# Patient Record
Sex: Female | Born: 1935 | Race: White | Hispanic: No | Smoking: Never smoker
Health system: Southern US, Community
[De-identification: ages and names within clinical notes are randomized; demographics above are authoritative.]

## PROBLEM LIST (undated history)

## (undated) DIAGNOSIS — E079 Disorder of thyroid, unspecified: Secondary | ICD-10-CM

## (undated) DIAGNOSIS — G3184 Mild cognitive impairment, so stated: Secondary | ICD-10-CM

## (undated) DIAGNOSIS — F0390 Unspecified dementia without behavioral disturbance: Secondary | ICD-10-CM

## (undated) DIAGNOSIS — E039 Hypothyroidism, unspecified: Secondary | ICD-10-CM

## (undated) DIAGNOSIS — E785 Hyperlipidemia, unspecified: Secondary | ICD-10-CM

## (undated) DIAGNOSIS — I10 Essential (primary) hypertension: Secondary | ICD-10-CM

## (undated) DIAGNOSIS — H269 Unspecified cataract: Secondary | ICD-10-CM

---

## 2015-03-10 ENCOUNTER — Other Ambulatory Visit
Admission: RE | Admit: 2015-03-10 | Discharge: 2015-03-10 | Disposition: A | Discharge status: Home / Self Care | Payer: Medicare Other | Source: Ambulatory Visit | Attending: Nurse Practitioner | Admitting: Nurse Practitioner

## 2015-03-10 DIAGNOSIS — Z029 Encounter for administrative examinations, unspecified: Secondary | ICD-10-CM | POA: Insufficient documentation

## 2015-03-10 LAB — URINALYSIS COMPLETE WITH MICROSCOPIC (ARMC ONLY)
BILIRUBIN URINE: NEGATIVE
Glucose, UA: NEGATIVE mg/dL
KETONES UR: NEGATIVE mg/dL
Nitrite: POSITIVE — AB
PH: 5 (ref 5.0–8.0)
Protein, ur: NEGATIVE mg/dL
SPECIFIC GRAVITY, URINE: 1.01 (ref 1.005–1.030)

## 2015-03-14 ENCOUNTER — Other Ambulatory Visit
Admission: RE | Admit: 2015-03-14 | Discharge: 2015-03-14 | Disposition: A | Discharge status: Home / Self Care | Payer: Medicare Other | Source: Ambulatory Visit | Attending: Nurse Practitioner | Admitting: Nurse Practitioner

## 2015-03-14 DIAGNOSIS — R410 Disorientation, unspecified: Secondary | ICD-10-CM | POA: Insufficient documentation

## 2015-03-14 LAB — COMPREHENSIVE METABOLIC PANEL
ALT: 16 U/L (ref 14–54)
ANION GAP: 13 (ref 5–15)
AST: 23 U/L (ref 15–41)
Albumin: 4.1 g/dL (ref 3.5–5.0)
Alkaline Phosphatase: 95 U/L (ref 38–126)
BUN: 26 mg/dL — ABNORMAL HIGH (ref 6–20)
CALCIUM: 9.6 mg/dL (ref 8.9–10.3)
CO2: 25 mmol/L (ref 22–32)
CREATININE: 1.26 mg/dL — AB (ref 0.44–1.00)
Chloride: 100 mmol/L — ABNORMAL LOW (ref 101–111)
GFR calc Af Amer: 43 mL/min — ABNORMAL LOW (ref >60)
GFR calc non Af Amer: 37 mL/min — ABNORMAL LOW (ref >60)
GLUCOSE: 125 mg/dL — AB (ref 65–99)
Potassium: 4 mmol/L (ref 3.5–5.1)
Sodium: 138 mmol/L (ref 135–145)
TOTAL PROTEIN: 7.4 g/dL (ref 6.5–8.1)
Total Bilirubin: 0.2 mg/dL — ABNORMAL LOW (ref 0.3–1.2)

## 2015-03-14 LAB — CBC WITH DIFFERENTIAL/PLATELET
Basophils Absolute: 0 10*3/uL (ref 0–0.1)
Basophils Relative: 1 %
EOS ABS: 0.1 10*3/uL (ref 0–0.7)
EOS PCT: 1 %
HCT: 33.3 % — ABNORMAL LOW (ref 35.0–47.0)
HEMOGLOBIN: 11.2 g/dL — AB (ref 12.0–16.0)
LYMPHS ABS: 1.1 10*3/uL (ref 1.0–3.6)
LYMPHS PCT: 22 %
MCH: 31 pg (ref 26.0–34.0)
MCHC: 33.5 g/dL (ref 32.0–36.0)
MCV: 92.8 fL (ref 80.0–100.0)
MONOS PCT: 11 %
Monocytes Absolute: 0.5 10*3/uL (ref 0.2–0.9)
Neutro Abs: 3.2 10*3/uL (ref 1.4–6.5)
Neutrophils Relative %: 65 %
PLATELETS: 248 10*3/uL (ref 150–440)
RBC: 3.59 MIL/uL — ABNORMAL LOW (ref 3.80–5.20)
RDW: 14.1 % (ref 11.5–14.5)
WBC: 5 10*3/uL (ref 3.6–11.0)

## 2015-03-14 LAB — URINALYSIS COMPLETE WITH MICROSCOPIC (ARMC ONLY)
BILIRUBIN URINE: NEGATIVE
Glucose, UA: NEGATIVE mg/dL
KETONES UR: NEGATIVE mg/dL
NITRITE: NEGATIVE
Protein, ur: NEGATIVE mg/dL
SQUAMOUS EPITHELIAL / LPF: NONE SEEN — AB
Specific Gravity, Urine: 1.015 (ref 1.005–1.030)
pH: 6 (ref 5.0–8.0)

## 2015-03-16 ENCOUNTER — Telehealth: Payer: Self-pay | Admitting: Emergency Medicine

## 2015-03-16 NOTE — ED Notes (Signed)
Corbin AdeLynne-Anne Wolf RN notified Cammie Mcgeerystal [place] at [facility] that the patient Jannette SpannerGeraldine Baise was not seen [facility] and the lab orders were not ordered by any of our urgent care physicians.  She was informed that these Lab results would need to be notified to the physician who ordered these labs for the [facility] or her primary physician if he or she is the ordering physician.

## 2015-03-17 LAB — URINE CULTURE

## 2015-03-17 LAB — TSH: TSH: 0.441 u[IU]/mL (ref 0.350–4.500)

## 2015-03-21 ENCOUNTER — Other Ambulatory Visit
Admission: RE | Admit: 2015-03-21 | Discharge: 2015-03-21 | Disposition: A | Discharge status: Home / Self Care | Payer: Medicare Other | Source: Ambulatory Visit | Attending: Nurse Practitioner | Admitting: Nurse Practitioner

## 2015-03-21 DIAGNOSIS — R410 Disorientation, unspecified: Secondary | ICD-10-CM | POA: Insufficient documentation

## 2015-03-21 LAB — CBC WITH DIFFERENTIAL/PLATELET
BASOS PCT: 1 %
Basophils Absolute: 0 10*3/uL (ref 0–0.1)
Eosinophils Absolute: 0.1 10*3/uL (ref 0–0.7)
Eosinophils Relative: 1 %
HEMATOCRIT: 33 % — AB (ref 35.0–47.0)
HEMOGLOBIN: 11.1 g/dL — AB (ref 12.0–16.0)
LYMPHS PCT: 10 %
Lymphs Abs: 0.8 10*3/uL — ABNORMAL LOW (ref 1.0–3.6)
MCH: 31.2 pg (ref 26.0–34.0)
MCHC: 33.7 g/dL (ref 32.0–36.0)
MCV: 92.6 fL (ref 80.0–100.0)
MONOS PCT: 10 %
Monocytes Absolute: 0.9 10*3/uL (ref 0.2–0.9)
NEUTROS ABS: 6.5 10*3/uL (ref 1.4–6.5)
NEUTROS PCT: 78 %
PLATELETS: 255 10*3/uL (ref 150–440)
RBC: 3.56 MIL/uL — ABNORMAL LOW (ref 3.80–5.20)
RDW: 14.3 % (ref 11.5–14.5)
WBC: 8.3 10*3/uL (ref 3.6–11.0)

## 2015-03-21 LAB — COMPREHENSIVE METABOLIC PANEL
ALK PHOS: 93 U/L (ref 38–126)
ALT: 22 U/L (ref 14–54)
AST: 32 U/L (ref 15–41)
Albumin: 4.1 g/dL (ref 3.5–5.0)
Anion gap: 14 (ref 5–15)
BILIRUBIN TOTAL: 0.5 mg/dL (ref 0.3–1.2)
BUN: 29 mg/dL — AB (ref 6–20)
CALCIUM: 9.2 mg/dL (ref 8.9–10.3)
CO2: 24 mmol/L (ref 22–32)
Chloride: 98 mmol/L — ABNORMAL LOW (ref 101–111)
Creatinine, Ser: 1.51 mg/dL — ABNORMAL HIGH (ref 0.44–1.00)
GFR calc non Af Amer: 30 mL/min — ABNORMAL LOW (ref >60)
GFR, EST AFRICAN AMERICAN: 35 mL/min — AB (ref >60)
GLUCOSE: 134 mg/dL — AB (ref 65–99)
Potassium: 3.2 mmol/L — ABNORMAL LOW (ref 3.5–5.1)
SODIUM: 136 mmol/L (ref 135–145)
Total Protein: 7.6 g/dL (ref 6.5–8.1)

## 2015-05-09 ENCOUNTER — Other Ambulatory Visit
Admission: RE | Admit: 2015-05-09 | Discharge: 2015-05-09 | Disposition: A | Discharge status: Home / Self Care | Payer: Medicare Other | Source: Ambulatory Visit | Attending: Nurse Practitioner | Admitting: Nurse Practitioner

## 2015-05-09 DIAGNOSIS — E039 Hypothyroidism, unspecified: Secondary | ICD-10-CM | POA: Diagnosis not present

## 2015-05-09 DIAGNOSIS — F0390 Unspecified dementia without behavioral disturbance: Secondary | ICD-10-CM | POA: Insufficient documentation

## 2015-05-09 LAB — CBC
HCT: 35.1 % (ref 35.0–47.0)
Hemoglobin: 11.8 g/dL — ABNORMAL LOW (ref 12.0–16.0)
MCH: 32 pg (ref 26.0–34.0)
MCHC: 33.6 g/dL (ref 32.0–36.0)
MCV: 95.1 fL (ref 80.0–100.0)
Platelets: 278 10*3/uL (ref 150–440)
RBC: 3.69 MIL/uL — AB (ref 3.80–5.20)
RDW: 15.6 % — AB (ref 11.5–14.5)
WBC: 7.5 10*3/uL (ref 3.6–11.0)

## 2015-05-09 LAB — COMPREHENSIVE METABOLIC PANEL
ALBUMIN: 4.5 g/dL (ref 3.5–5.0)
ALT: 13 U/L — AB (ref 14–54)
AST: 23 U/L (ref 15–41)
Alkaline Phosphatase: 89 U/L (ref 38–126)
Anion gap: 14 (ref 5–15)
BUN: 24 mg/dL — AB (ref 6–20)
CHLORIDE: 100 mmol/L — AB (ref 101–111)
CO2: 23 mmol/L (ref 22–32)
CREATININE: 1.08 mg/dL — AB (ref 0.44–1.00)
Calcium: 9.8 mg/dL (ref 8.9–10.3)
GFR calc Af Amer: 52 mL/min — ABNORMAL LOW (ref >60)
GFR, EST NON AFRICAN AMERICAN: 45 mL/min — AB (ref >60)
GLUCOSE: 153 mg/dL — AB (ref 65–99)
POTASSIUM: 3.6 mmol/L (ref 3.5–5.1)
Sodium: 137 mmol/L (ref 135–145)
Total Bilirubin: 0.5 mg/dL (ref 0.3–1.2)
Total Protein: 7.3 g/dL (ref 6.5–8.1)

## 2015-05-09 LAB — TSH: TSH: 1.271 u[IU]/mL (ref 0.350–4.500)

## 2015-05-10 ENCOUNTER — Other Ambulatory Visit
Admission: RE | Admit: 2015-05-10 | Discharge: 2015-05-10 | Disposition: A | Discharge status: Home / Self Care | Payer: Medicare Other | Source: Ambulatory Visit | Attending: Nurse Practitioner | Admitting: Nurse Practitioner

## 2015-05-10 DIAGNOSIS — N390 Urinary tract infection, site not specified: Secondary | ICD-10-CM | POA: Insufficient documentation

## 2015-05-10 LAB — URINALYSIS COMPLETE WITH MICROSCOPIC (ARMC ONLY)
Bilirubin Urine: NEGATIVE
Glucose, UA: NEGATIVE mg/dL
Ketones, ur: NEGATIVE mg/dL
Nitrite: POSITIVE — AB
PROTEIN: NEGATIVE mg/dL
RBC / HPF: NONE SEEN RBC/hpf (ref <3)
Specific Gravity, Urine: 1.01 (ref 1.005–1.030)
pH: 6 (ref 5.0–8.0)

## 2015-05-12 LAB — URINE CULTURE: Culture: >=100000

## 2015-08-23 ENCOUNTER — Other Ambulatory Visit
Admission: RE | Admit: 2015-08-23 | Discharge: 2015-08-23 | Disposition: A | Discharge status: Home / Self Care | Payer: Medicare Other | Source: Skilled Nursing Facility | Attending: Nurse Practitioner | Admitting: Nurse Practitioner

## 2015-08-23 DIAGNOSIS — R413 Other amnesia: Secondary | ICD-10-CM | POA: Insufficient documentation

## 2015-08-23 LAB — URINALYSIS COMPLETE WITH MICROSCOPIC (ARMC ONLY)
Bilirubin Urine: NEGATIVE
Glucose, UA: NEGATIVE mg/dL
KETONES UR: NEGATIVE mg/dL
Nitrite: NEGATIVE
PH: 5 (ref 5.0–8.0)
PROTEIN: NEGATIVE mg/dL
SPECIFIC GRAVITY, URINE: 1.01 (ref 1.005–1.030)

## 2015-08-25 LAB — URINE CULTURE

## 2015-09-19 ENCOUNTER — Emergency Department: Payer: Medicare Other

## 2015-09-19 ENCOUNTER — Emergency Department
Admission: EM | Admit: 2015-09-19 | Discharge: 2015-09-19 | Disposition: A | Discharge status: Home / Self Care | Payer: Medicare Other | Attending: Emergency Medicine | Admitting: Emergency Medicine

## 2015-09-19 ENCOUNTER — Encounter: Payer: Self-pay | Admitting: Emergency Medicine

## 2015-09-19 DIAGNOSIS — I10 Essential (primary) hypertension: Secondary | ICD-10-CM | POA: Diagnosis not present

## 2015-09-19 DIAGNOSIS — S6992XA Unspecified injury of left wrist, hand and finger(s), initial encounter: Secondary | ICD-10-CM | POA: Diagnosis present

## 2015-09-19 DIAGNOSIS — Z7982 Long term (current) use of aspirin: Secondary | ICD-10-CM | POA: Diagnosis not present

## 2015-09-19 DIAGNOSIS — Y998 Other external cause status: Secondary | ICD-10-CM | POA: Insufficient documentation

## 2015-09-19 DIAGNOSIS — W1839XA Other fall on same level, initial encounter: Secondary | ICD-10-CM | POA: Diagnosis not present

## 2015-09-19 DIAGNOSIS — Y9389 Activity, other specified: Secondary | ICD-10-CM | POA: Insufficient documentation

## 2015-09-19 DIAGNOSIS — S52592A Other fractures of lower end of left radius, initial encounter for closed fracture: Secondary | ICD-10-CM | POA: Insufficient documentation

## 2015-09-19 DIAGNOSIS — Y92128 Other place in nursing home as the place of occurrence of the external cause: Secondary | ICD-10-CM | POA: Insufficient documentation

## 2015-09-19 DIAGNOSIS — S62102A Fracture of unspecified carpal bone, left wrist, initial encounter for closed fracture: Secondary | ICD-10-CM

## 2015-09-19 DIAGNOSIS — S60222A Contusion of left hand, initial encounter: Secondary | ICD-10-CM | POA: Insufficient documentation

## 2015-09-19 DIAGNOSIS — Z79899 Other long term (current) drug therapy: Secondary | ICD-10-CM | POA: Insufficient documentation

## 2015-09-19 HISTORY — DX: Hyperlipidemia, unspecified: E78.5

## 2015-09-19 HISTORY — DX: Essential (primary) hypertension: I10

## 2015-09-19 HISTORY — DX: Mild cognitive impairment of uncertain or unknown etiology: G31.84

## 2015-09-19 NOTE — ED Notes (Signed)
See triage note   States she fell 2 weeks ago. Left wrist bruised and swollen  Good pulses  Deformity present

## 2015-09-19 NOTE — ED Notes (Addendum)
Pt has had left wrist pain for 2 weeks. Bruising and swelling to left wrist.  Pt reports hurting wrist by falling.  Pt does not remember why initially fell.  Portable xray done at nursing facility.  Possible acute fracture vs chronic fracture. Sent here for further evaluation. Pt has xray report but no imaging. +2 radial pulse left arm.

## 2015-09-19 NOTE — ED Provider Notes (Signed)
[facility] Emergency Department Provider Note  ____________________________________________  Time seen: Approximately 1:53 PM  I have reviewed the triage vital signs and the nursing notes.   HISTORY  Chief Complaint Wrist Pain  History limited secondary to mental status at present. More history obtained after calling nursing facility.  HPI Alice Manning is a 82 y.o. female who presents today for left wrist pain for one week. Patient thinks she may have fallen and hurt her wrist but doesn't remember when or why she fell or how long her wrist has been hurting. Per nursing facility report, patient wandered out of facility and fell on her wrist last Monday 09/11/2015. After noticing swelling and bruising in her left wrist, nursing facility completed a portable xray. Reports sent with patient but no imaging. Patient states it is "a little bit" painful. Patient unsure of why she came to the ED other than someone brought her. Denies any pain other than left wrist.    Past Medical History  Diagnosis Date  . Hyperlipemia   . Hypertension   . Mild cognitive impairment     There are no active problems to display for this patient.   History reviewed. No pertinent past surgical history.  Current Outpatient Rx  Name  Route  Sig  Dispense  Refill  . aspirin 81 MG tablet   Oral   Take 81 mg by mouth daily.         Marland Kitchen diltiazem (TIAZAC) 360 MG 24 hr capsule   Oral   Take 360 mg by mouth daily.         Marland Kitchen escitalopram (LEXAPRO) 20 MG tablet   Oral   Take 20 mg by mouth daily.         . furosemide (LASIX) 40 MG tablet   Oral   Take 40 mg by mouth.         . levothyroxine (SYNTHROID, LEVOTHROID) 75 MCG tablet   Oral   Take 75 mcg by mouth daily before breakfast.         . lisinopril (PRINIVIL,ZESTRIL) 40 MG tablet   Oral   Take 40 mg by mouth daily.         . memantine (NAMENDA) 10 MG tablet   Oral   Take 10 mg by mouth 2 (two) times  daily.         . vitamin B-12 (CYANOCOBALAMIN) 100 MCG tablet   Oral   Take 100 mcg by mouth daily.           Allergies Review of patient's allergies indicates no known allergies.  History reviewed. No pertinent family history.  Social History Social History  Substance Use Topics  . Smoking status: Never Smoker   . Smokeless tobacco: None  . Alcohol Use: No    Review of Systems Constitutional: No fever/chills Eyes: No visual changes. ENT: No sore throat. Cardiovascular: Denies chest pain. Respiratory: Denies shortness of breath. Gastrointestinal: No abdominal pain.  No nausea, no vomiting.  No diarrhea.  No constipation. Genitourinary: Negative for dysuria. Musculoskeletal: Positive for left wrist and finger pain . Negative for back pain. Skin: Positive for swelling and bruising of left hand. Negative for rash. Neurological: Positive for weakness in left hand due to injury. Negative for headaches or numbness.   10-point ROS otherwise negative.  ____________________________________________   PHYSICAL EXAM:  VITAL SIGNS: ED Triage Vitals  Enc Vitals Group     BP 09/19/15 1332 112/87 mmHg     Pulse Rate  09/19/15 1332 73     Resp 09/19/15 1332 18     Temp 09/19/15 1332 98.2 F (36.8 C)     Temp Source 09/19/15 1332 Oral     SpO2 09/19/15 1332 98 %     Weight 09/19/15 1332 104 lb 12.8 oz (47.537 kg)     Height --      Head Cir --      Peak Flow --      Pain Score 09/19/15 1328 5     Pain Loc --      Pain Edu? --      Excl. in GC? --     Constitutional: Alert and slightly unsure of circumstances surrounding present condition with wrist and visit to ED. Well appearing and in no acute distress. Eyes: Conjunctivae are normal. PERRL. Left eye slightly deviant laterally. Head: Atraumatic. Nose: No congestion/rhinnorhea. Mouth/Throat: Mucous membranes are moist.  Oropharynx non-erythematous. Neck: No stridor.  Cardiovascular: Normal rate, regular rhythm.  Grossly normal heart sounds.  Good peripheral circulation. Respiratory: Normal respiratory effort.  No retractions. Lungs CTAB. Gastrointestinal: Soft and nontender. No distention. No abdominal bruits. No CVA tenderness. Musculoskeletal: Limited ROM in all directions in left wrist when compared to right wrist. ROM in limited in first digit of left hand but in tact in all other digits. Decreased strength in left wrist and digits. Significant edema in left wrist with obvious deformity. No lower extremity tenderness nor edema.  No joint effusions. Neurologic:  Normal speech and language. No gross focal neurologic deficits are appreciated. No gait instability. Skin:  Significant ecchymosis on anterior aspect of left forearm and wrist. Skin is warm, dry and intact.  Psychiatric: Mood and affect are slightly blunted. Patient seems confused about circumstances and why she is in the ED. Speech and behavior are normal.  ____________________________________________   LABS (all labs ordered are listed, but only abnormal results are displayed)  Labs Reviewed - No data to display ____________________________________________  EKG   ____________________________________________  RADIOLOGY  Comminuted fracture of the distal radius with dorsal angulation of 30 ____________________________________________   PROCEDURES  Procedure(s) performed: None  Critical Care performed: No  ____________________________________________   INITIAL IMPRESSION / ASSESSMENT AND PLAN / ED COURSE  Pertinent labs & imaging results that were available during my care of the patient were reviewed by me and considered in my medical decision making (see chart for details).  Left wrist fracture. Discussed  x-ray finding with the patient and daughter. Patient placed in a volar splint and advised follow orthopedics by calling for an appointment in the morning. ____________________________________________   FINAL  CLINICAL IMPRESSION(S) / ED DIAGNOSES  Final diagnoses:  Wrist fracture, left, closed, initial encounter      Joni Reining, PA-C 09/19/15 1442  Rockne Menghini, MD 09/19/15 [phone]

## 2015-09-19 NOTE — Discharge Instructions (Signed)
Wear splint until evaluation by orthopedic clinic Wrist Fracture A wrist fracture is a break or crack in one of the bones of your wrist. Your wrist is made up of eight small bones at the palm of your hand (carpal bones) and two long bones that make up your forearm (radius and ulna). CAUSES  A direct blow to the wrist.  Falling on an outstretched hand.  Trauma, such as a car accident or a fall. RISK FACTORS Risk factors for wrist fracture include:  Participating in contact and high-risk sports, such as skiing, biking, and ice skating.  Taking steroid medicines.  Smoking.  Being female.  Being Caucasian.  Drinking more than three alcoholic beverages per day.  Having low or lowered bone density (osteoporosis or osteopenia).  Age. Older adults have decreased bone density.  Women who have had menopause.  History of previous fractures. SIGNS AND SYMPTOMS Symptoms of wrist fractures include tenderness, bruising, and inflammation. Additionally, the wrist may hang in an odd position or appear deformed. DIAGNOSIS Diagnosis may include:  Physical exam.  X-ray. TREATMENT Treatment depends on many factors, including the nature and location of the fracture, your age, and your activity level. Treatment for wrist fracture can be nonsurgical or surgical. Nonsurgical Treatment A plaster cast or splint may be applied to your wrist if the bone is in a good position. If the fracture is not in good position, it may be necessary for your health care provider to realign it before applying a splint or cast. Usually, a cast or splint will be worn for several weeks. Surgical Treatment Sometimes the position of the bone is so far out of place that surgery is required to apply a device to hold it together as it heals. Depending on the fracture, there are a number of options for holding the bone in place while it heals, such as a cast and metal pins. HOME CARE INSTRUCTIONS  Keep your injured wrist  elevated and move your fingers as much as possible.  Do not put pressure on any part of your cast or splint. It may break.  Use a plastic bag to protect your cast or splint from water while bathing or showering. Do not lower your cast or splint into water.  Take medicines only as directed by your health care provider.  Keep your cast or splint clean and dry. If it becomes wet, damaged, or suddenly feels too tight, contact your health care provider right away.  Do not use any tobacco products including cigarettes, chewing tobacco, or electronic cigarettes. Tobacco can delay bone healing. If you need help quitting, ask your health care provider.  Keep all follow-up visits as directed by your health care provider. This is important.  Ask your health care provider if you should take supplements of calcium and vitamins C and D to promote bone healing. SEEK MEDICAL CARE IF:  Your cast or splint is damaged, breaks, or gets wet.  You have a fever.  You have chills.  You have continued severe pain or more swelling than you did before the cast was put on. SEEK IMMEDIATE MEDICAL CARE IF:  Your hand or fingernails on the injured arm turn blue or gray, or feel cold or numb.  You have decreased feeling in the fingers of your injured arm. MAKE SURE YOU:  Understand these instructions.  Will watch your condition.  Will get help right away if you are not doing well or get worse.   This information is not intended to  replace advice given to you by your health care provider. Make sure you discuss any questions you have with your health care provider.   Document Released: 05/29/2005 Document Revised: 05/10/2015 Document Reviewed: 09/06/2011 Elsevier Interactive Patient Education [employer]

## 2015-12-16 ENCOUNTER — Emergency Department: Payer: Medicare Other

## 2015-12-16 ENCOUNTER — Emergency Department
Admission: EM | Admit: 2015-12-16 | Discharge: 2015-12-16 | Disposition: A | Discharge status: Home / Self Care | Payer: Medicare Other | Attending: Emergency Medicine | Admitting: Emergency Medicine

## 2015-12-16 ENCOUNTER — Encounter: Payer: Self-pay | Admitting: *Deleted

## 2015-12-16 DIAGNOSIS — E785 Hyperlipidemia, unspecified: Secondary | ICD-10-CM | POA: Insufficient documentation

## 2015-12-16 DIAGNOSIS — I10 Essential (primary) hypertension: Secondary | ICD-10-CM | POA: Insufficient documentation

## 2015-12-16 DIAGNOSIS — S42002A Fracture of unspecified part of left clavicle, initial encounter for closed fracture: Secondary | ICD-10-CM | POA: Insufficient documentation

## 2015-12-16 DIAGNOSIS — Z7982 Long term (current) use of aspirin: Secondary | ICD-10-CM | POA: Insufficient documentation

## 2015-12-16 DIAGNOSIS — Y939 Activity, unspecified: Secondary | ICD-10-CM | POA: Diagnosis not present

## 2015-12-16 DIAGNOSIS — F0390 Unspecified dementia without behavioral disturbance: Secondary | ICD-10-CM | POA: Insufficient documentation

## 2015-12-16 DIAGNOSIS — Y999 Unspecified external cause status: Secondary | ICD-10-CM | POA: Diagnosis not present

## 2015-12-16 DIAGNOSIS — W19XXXA Unspecified fall, initial encounter: Secondary | ICD-10-CM | POA: Insufficient documentation

## 2015-12-16 DIAGNOSIS — Y929 Unspecified place or not applicable: Secondary | ICD-10-CM | POA: Insufficient documentation

## 2015-12-16 DIAGNOSIS — M79643 Pain in unspecified hand: Secondary | ICD-10-CM

## 2015-12-16 DIAGNOSIS — Z79899 Other long term (current) drug therapy: Secondary | ICD-10-CM | POA: Insufficient documentation

## 2015-12-16 DIAGNOSIS — M79642 Pain in left hand: Secondary | ICD-10-CM | POA: Diagnosis present

## 2015-12-16 LAB — URINALYSIS COMPLETE WITH MICROSCOPIC (ARMC ONLY)
Bilirubin Urine: NEGATIVE
GLUCOSE, UA: NEGATIVE mg/dL
KETONES UR: NEGATIVE mg/dL
NITRITE: POSITIVE — AB
Protein, ur: NEGATIVE mg/dL
SPECIFIC GRAVITY, URINE: 1.008 (ref 1.005–1.030)
pH: 6 (ref 5.0–8.0)

## 2015-12-16 LAB — GLUCOSE, CAPILLARY: Glucose-Capillary: 89 mg/dL (ref 65–99)

## 2015-12-16 MED ORDER — IBUPROFEN 400 MG PO TABS
400.0000 mg | ORAL_TABLET | Freq: Once | ORAL | Status: AC
  Administered 2015-12-16: 400 mg via ORAL

## 2015-12-16 MED ORDER — FOSFOMYCIN TROMETHAMINE 3 G PO PACK
3.0000 g | PACK | ORAL | Status: AC
  Administered 2015-12-16: 3 g via ORAL
  Filled 2015-12-16: qty 3

## 2015-12-16 MED ORDER — ACETAMINOPHEN 325 MG PO TABS
650.0000 mg | ORAL_TABLET | Freq: Once | ORAL | Status: AC
  Administered 2015-12-16: 650 mg via ORAL
  Filled 2015-12-16: qty 2

## 2015-12-16 MED ORDER — IBUPROFEN 400 MG PO TABS
ORAL_TABLET | ORAL | Status: AC
  Administered 2015-12-16: 400 mg via ORAL
  Filled 2015-12-16: qty 1

## 2015-12-16 NOTE — ED Notes (Signed)
Pt. Family taking pt. To [facility].

## 2015-12-16 NOTE — ED Notes (Signed)
Report given to Kim, RN.

## 2015-12-16 NOTE — ED Notes (Signed)
Patient transported to CT 

## 2015-12-16 NOTE — ED Provider Notes (Addendum)
[facility] Emergency Department Provider Note  ____________________________________________   I have reviewed the triage vital signs and the nursing notes.   HISTORY  Chief Complaint Fall    HPI Alice Manning is a 82 y.o. female who presents today after a fall. Patient is significantly demented, family states she is at her baseline. She takes aspirin but no other blood thinners. She hit her head. There is no reported loss of consciousness. History is limited because patient's dementia. She states she remembers falling. Patient has chronic pain in her left arm from a prior wrist injury, but she states she is having some shoulder discomfort this time as well.   Past Medical History  Diagnosis Date  . Hyperlipemia   . Hypertension   . Mild cognitive impairment     There are no active problems to display for this patient.   History reviewed. No pertinent past surgical history.  Current Outpatient Rx  Name  Route  Sig  Dispense  Refill  . aspirin 81 MG tablet   Oral   Take 81 mg by mouth daily.         Marland Kitchen. diltiazem (TIAZAC) 360 MG 24 hr capsule   Oral   Take 360 mg by mouth daily.         Marland Kitchen. escitalopram (LEXAPRO) 20 MG tablet   Oral   Take 20 mg by mouth daily.         . furosemide (LASIX) 40 MG tablet   Oral   Take 40 mg by mouth.         . levothyroxine (SYNTHROID, LEVOTHROID) 75 MCG tablet   Oral   Take 75 mcg by mouth daily before breakfast.         . lisinopril (PRINIVIL,ZESTRIL) 40 MG tablet   Oral   Take 40 mg by mouth daily.         . memantine (NAMENDA) 10 MG tablet   Oral   Take 10 mg by mouth 2 (two) times daily.         . vitamin B-12 (CYANOCOBALAMIN) 100 MCG tablet   Oral   Take 100 mcg by mouth daily.           Allergies Review of patient's allergies indicates no known allergies.  History reviewed. No pertinent family history.  Social History Social History  Substance Use Topics  . Smoking  status: Never Smoker   . Smokeless tobacco: None  . Alcohol Use: No    Review of Systems Constitutional: No fever/chills Eyes: No visual changes. ENT: No sore throat. No stiff neck no neck pain Cardiovascular: Denies chest pain. Respiratory: Denies shortness of breath. Gastrointestinal:   no vomiting.  No diarrhea.  No constipation. Genitourinary: Negative for dysuria. Musculoskeletal: Negative lower extremity swelling Skin: Negative for rash. Neurological: Negative for headaches, focal weakness or numbness. 10-point ROS otherwise negative.  ____________________________________________   PHYSICAL EXAM:  VITAL SIGNS: ED Triage Vitals  Enc Vitals Group     BP 12/16/15 1244 127/51 mmHg     Pulse Rate 12/16/15 1244 71     Resp 12/16/15 1244 16     Temp 12/16/15 1244 98.8 F (37.1 C)     Temp Source 12/16/15 1244 Oral     SpO2 12/16/15 1244 100 %     Weight 12/16/15 1244 110 lb (49.896 kg)     Height 12/16/15 1244 5\' 4"  (1.626 m)     Head Cir --      Peak  Flow --      Pain Score --      Pain Loc --      Pain Edu? --      Excl. in GC? --     Constitutional: Alert and oriented. Well appearing and in no acute distress. Eyes: Conjunctivae are normal. PERRL. EOMI. Head: Superficial abrasion to the left orbital ridge no skull fracture palpated. Nose: No congestion/rhinnorhea. Mouth/Throat: Mucous membranes are moist.  Oropharynx non-erythematous. Neck: No stridor.   Nontender with no meningismus Cardiovascular: Normal rate, regular rhythm. Grossly normal heart sounds.  Good peripheral circulation. Respiratory: Normal respiratory effort.  No retractions. Lungs CTAB. Abdominal: Soft and nontender. No distention. No guarding no rebound Back:  There is no focal tenderness or step off there is no midline tenderness there are no lesions noted. there is no CVA tenderness Musculoskeletal: There is tenderness to palpation of the distal clavicle, she also has tenderness to palpation  and some mild swelling of the left hand and wrist which appears to be chronic pulses are noted. No lower extremity tenderness. No joint effusions, no DVT signs strong distal pulses no edema Neurologic:  Normal speech and language. No gross focal neurologic deficits are appreciated.  Skin:  Skin is warm, dry and intact. No rash noted. Psychiatric: Mood and affect are normal. Speech and behavior are normal.  ____________________________________________   LABS (all labs ordered are listed, but only abnormal results are displayed)  Labs Reviewed - No data to display ____________________________________________  EKG  I personally interpreted any EKGs ordered by me or triage  ____________________________________________  RADIOLOGY  I reviewed any imaging ordered by me or triage that were performed during my shift and, if possible, patient and/or family made aware of any abnormal findings. ____________________________________________   PROCEDURES  Procedure(s) performed: LACERATION REPAIR Performed by: Jeanmarie Plant Authorized by: Jeanmarie Plant Consent: Verbal consent obtained. Risks and benefits: risks, benefits and alternatives were discussed Consent given by: patient Patient identity confirmed: provided demographic data Prepped and Draped in normal sterile fashion Wound explored  Laceration Location: Left renal ridge  Laceration Length: 2.7 cm  No Foreign Bodies seen or palpated  Anesthesia: local infiltration  Local anesthetic:0  Anesthetic total: 0 ml  Irrigation method: syringe Amount of cleaning: standard  Skin closure: Dermabond   Number of sutures: 0   Technique: dermabond  Patient tolerance: Patient tolerated the procedure well with no immediate complications.   Critical Care performed: None  ____________________________________________   INITIAL IMPRESSION / ASSESSMENT AND PLAN / ED COURSE  Pertinent labs & imaging results that were  available during my care of the patient were reviewed by me and considered in my medical decision making (see chart for details).  Patient with a non-syncopal fall, at her baseline according to family, he did ask me to not send blood work or do any other workup, patient has history of frequent falls and they feel that she is at her baseline. I do not think this is unreasonable. I have imaged her left arm for this pain, she does have a clavicular fracture, we will see if other injuries are present. No other area of concern is noted by the patient. I believe that we can likely repair the area that was injured on her face with Dermabond.  ----------------------------------------- 4:00 PM on 12/16/2015 -----------------------------------------  The patient is in no acute distress, at her baseline according to family. They say she will not take anything started and Tylenol for pain which we will  administer and they will give at home. Neurovascularly intact. She does have some chronic swelling after her last injury in her hand but no evidence of DVT and there are strong distal pulses, we will advise close follow-up patient follow up with orthopedic surgery for her new fracture and for reassessment of her old. No evidence of acute wrist fracture at this time. ____________________________________________   FINAL CLINICAL IMPRESSION(S) / ED DIAGNOSES  Final diagnoses:  Hand pain      This chart was dictated using voice recognition software.  Despite best efforts to proofread,  errors can occur which can change meaning.     Jeanmarie Plant, MD 12/16/15 1503  Jeanmarie Plant, MD 12/16/15 1503  Jeanmarie Plant, MD 12/16/15 1556  Jeanmarie Plant, MD 12/16/15 1600

## 2015-12-16 NOTE — ED Notes (Signed)
Pt to ED from [facility] after fall today. Pt with small abrasion noted to left side of head, above eyebrow. Bleeding controlled. Pt not currently taking blood thinners. Vitals stable at this time, NAD noted. Pt baseline dementia per EMS. MD Mcshane at bedside upon arrival.

## 2015-12-16 NOTE — Discharge Instructions (Signed)
Clavicle Fracture °A clavicle fracture is a broken collarbone. The collarbone is the long bone that connects your shoulder to your rib cage. A broken collarbone may be treated with a sling, a wrap, or surgery. Treatment depends on whether the broken ends of the bone are out of place or not. °HOME CARE °· Put ice on the injured area: °¨ Put ice in a plastic bag. °¨ Place a towel between your skin and the bag. °¨ Leave the ice on for 20 minutes, 2-3 times a day. °· If you have a wrap or splint: °¨ Wear it all the time, and remove it only to take a bath or shower. °¨ When you bathe or shower, keep your shoulder in the same place as when the sling or wrap is on. °¨ Do not lift your arm. °· If you have a wrap: °¨ Another person must tighten it every day. °¨ It should be tight enough to hold your shoulders back. °¨ Make sure you have enough room to put your pointer finger between your body and the strap. °¨ Loosen the wrap right away if you cannot feel your arm or your hands tingle. °· Only take medicines as told by your doctor. °· Avoid activities that make the injury or pain worse for 4-6 weeks after surgery. °· Keep all follow-up appointments. °GET HELP IF: °· Your medicine is not making you feel less pain. °· Your medicine is not making swelling better. °GET HELP RIGHT AWAY IF:  °· Your cannot feel your arm. °· Your arm is cold. °· Your arm is a lighter color than normal. °MAKE SURE YOU:  °· Understand these instructions. °· Will watch your condition. °· Will get help right away if you are not doing well or get worse. °  °This information is not intended to replace advice given to you by your health care provider. Make sure you discuss any questions you have with your health care provider. °  °Document Released: 02/05/2008 Document Revised: 08/24/2013 Document Reviewed: 07/12/2013 °Elsevier Interactive Patient Education ©2016 [employer] ° °

## 2015-12-22 ENCOUNTER — Other Ambulatory Visit
Admission: RE | Admit: 2015-12-22 | Discharge: 2015-12-22 | Disposition: A | Discharge status: Home / Self Care | Payer: Medicare Other | Source: Ambulatory Visit | Attending: Nurse Practitioner | Admitting: Nurse Practitioner

## 2015-12-22 DIAGNOSIS — R8290 Unspecified abnormal findings in urine: Secondary | ICD-10-CM | POA: Diagnosis present

## 2015-12-22 LAB — URINALYSIS COMPLETE WITH MICROSCOPIC (ARMC ONLY)
BACTERIA UA: NONE SEEN
Bilirubin Urine: NEGATIVE
Glucose, UA: NEGATIVE mg/dL
HGB URINE DIPSTICK: NEGATIVE
Ketones, ur: NEGATIVE mg/dL
LEUKOCYTES UA: NEGATIVE
NITRITE: NEGATIVE
PROTEIN: NEGATIVE mg/dL
Specific Gravity, Urine: 1.01 (ref 1.005–1.030)
Squamous Epithelial / LPF: NONE SEEN
WBC UA: NONE SEEN WBC/hpf (ref 0–5)
pH: 5.5 (ref 5.0–8.0)

## 2015-12-24 LAB — URINE CULTURE

## 2015-12-29 ENCOUNTER — Emergency Department: Payer: Medicare Other

## 2015-12-29 ENCOUNTER — Encounter: Payer: Self-pay | Admitting: Emergency Medicine

## 2015-12-29 ENCOUNTER — Emergency Department
Admission: EM | Admit: 2015-12-29 | Discharge: 2015-12-29 | Disposition: A | Discharge status: Home / Self Care | Payer: Medicare Other | Attending: Emergency Medicine | Admitting: Emergency Medicine

## 2015-12-29 DIAGNOSIS — Z7982 Long term (current) use of aspirin: Secondary | ICD-10-CM | POA: Diagnosis not present

## 2015-12-29 DIAGNOSIS — Y999 Unspecified external cause status: Secondary | ICD-10-CM | POA: Insufficient documentation

## 2015-12-29 DIAGNOSIS — S0003XA Contusion of scalp, initial encounter: Secondary | ICD-10-CM | POA: Insufficient documentation

## 2015-12-29 DIAGNOSIS — N3090 Cystitis, unspecified without hematuria: Secondary | ICD-10-CM | POA: Diagnosis not present

## 2015-12-29 DIAGNOSIS — Y9389 Activity, other specified: Secondary | ICD-10-CM | POA: Insufficient documentation

## 2015-12-29 DIAGNOSIS — Y929 Unspecified place or not applicable: Secondary | ICD-10-CM | POA: Diagnosis not present

## 2015-12-29 DIAGNOSIS — W050XXA Fall from non-moving wheelchair, initial encounter: Secondary | ICD-10-CM | POA: Diagnosis not present

## 2015-12-29 DIAGNOSIS — E785 Hyperlipidemia, unspecified: Secondary | ICD-10-CM | POA: Diagnosis not present

## 2015-12-29 DIAGNOSIS — W19XXXA Unspecified fall, initial encounter: Secondary | ICD-10-CM

## 2015-12-29 DIAGNOSIS — S0990XA Unspecified injury of head, initial encounter: Secondary | ICD-10-CM | POA: Diagnosis present

## 2015-12-29 DIAGNOSIS — I10 Essential (primary) hypertension: Secondary | ICD-10-CM | POA: Insufficient documentation

## 2015-12-29 DIAGNOSIS — Z79899 Other long term (current) drug therapy: Secondary | ICD-10-CM | POA: Diagnosis not present

## 2015-12-29 LAB — URINALYSIS COMPLETE WITH MICROSCOPIC (ARMC ONLY)
BILIRUBIN URINE: NEGATIVE
Glucose, UA: NEGATIVE mg/dL
Hgb urine dipstick: NEGATIVE
KETONES UR: NEGATIVE mg/dL
NITRITE: NEGATIVE
PROTEIN: NEGATIVE mg/dL
SPECIFIC GRAVITY, URINE: 1.001 — AB (ref 1.005–1.030)
pH: 6 (ref 5.0–8.0)

## 2015-12-29 MED ORDER — FOSFOMYCIN TROMETHAMINE 3 G PO PACK
PACK | ORAL | Status: AC
  Filled 2015-12-29: qty 3

## 2015-12-29 MED ORDER — FOSFOMYCIN TROMETHAMINE 3 G PO PACK
3.0000 g | PACK | Freq: Once | ORAL | Status: AC
  Administered 2015-12-29: 3 g via ORAL

## 2015-12-29 NOTE — ED Provider Notes (Signed)
[facility] Emergency Department Provider Note  ____________________________________________  Time seen: 11:30 AM  I have reviewed the triage vital signs and the nursing notes.   HISTORY  Chief Complaint Fall Level 5 caveat:  Portions of the history and physical were unable to be obtained due to the patient's chronic dementia    HPI Alice Manning is a 82 y.o. female from [place] with a history of dementia who was reported to be sitting in her wheelchair when she leaned forward and fell forward out of the wheelchair. She is found on the ground. Has swelling of the forehead and sent to the ED for evaluation. The patient denies any complaints.     Past Medical History  Diagnosis Date  . Hyperlipemia   . Hypertension   . Mild cognitive impairment      There are no active problems to display for this patient.    History reviewed. No pertinent past surgical history.   Current Outpatient Rx  Name  Route  Sig  Dispense  Refill  . acetaminophen (TYLENOL) 500 MG tablet   Oral   Take 1,000 mg by mouth 3 (three) times daily.         Marland Kitchen aspirin 81 MG tablet   Oral   Take 81 mg by mouth daily.         . Cranberry 450 MG CAPS   Oral   Take 1 capsule by mouth daily.         Marland Kitchen diltiazem (TIAZAC) 360 MG 24 hr capsule   Oral   Take 360 mg by mouth daily.         Marland Kitchen escitalopram (LEXAPRO) 20 MG tablet   Oral   Take 20 mg by mouth daily.         . furosemide (LASIX) 40 MG tablet   Oral   Take 40 mg by mouth daily.          Marland Kitchen levothyroxine (SYNTHROID, LEVOTHROID) 75 MCG tablet   Oral   Take 75 mcg by mouth daily before breakfast.         . lisinopril (PRINIVIL,ZESTRIL) 40 MG tablet   Oral   Take 40 mg by mouth daily.         . memantine (NAMENDA) 10 MG tablet   Oral   Take 10 mg by mouth 2 (two) times daily.         . Menthol (ICY HOT) 5 % PTCH   Apply externally   Apply 1 patch topically daily.         .  vitamin B-12 (CYANOCOBALAMIN) 1000 MCG tablet   Oral   Take 1,000 mcg by mouth daily.            Allergies Review of patient's allergies indicates no known allergies.   No family history on file.  Social History Social History  Substance Use Topics  . Smoking status: Never Smoker   . Smokeless tobacco: None  . Alcohol Use: No    Review of Systems Unable to obtain due to chronic dementia and unreliable history ____________________________________________   PHYSICAL EXAM:  VITAL SIGNS: ED Triage Vitals  Enc Vitals Group     BP 12/29/15 1140 125/56 mmHg     Pulse Rate 12/29/15 1140 71     Resp 12/29/15 1140 16     Temp 12/29/15 1140 98.5 F (36.9 C)     Temp src --      SpO2 12/29/15 1140 100 %  Weight 12/29/15 1138 109 lb (49.442 kg)     Height 12/29/15 1138 4\' 11"  (1.499 m)     Head Cir --      Peak Flow --      Pain Score --      Pain Loc --      Pain Edu? --      Excl. in GC? --     Vital signs reviewed, nursing assessments reviewed.   Constitutional:   Alert and orientedTo self. Well appearing and in no distress. Eyes:   No scleral icterus. No conjunctival pallor. PERRL. EOMI.  No nystagmus. ENT   Head:   Normocephalic with scalp contusion on the forehead with some swelling and bruising. No bony point tenderness..   Nose:   No congestion/rhinnorhea. No septal hematoma   Mouth/Throat:   MMM, no pharyngeal erythema. No peritonsillar mass.    Neck:   No stridor. No SubQ emphysema. No meningismus. No midline tenderness, full range of motion. Transported without c-collar Hematological/Lymphatic/Immunilogical:   No cervical lymphadenopathy. Cardiovascular:   RRR. Symmetric bilateral radial and DP pulses.  No murmurs.  Respiratory:   Normal respiratory effort without tachypnea nor retractions. Breath sounds are clear and equal bilaterally. No wheezes/rales/rhonchi. Gastrointestinal:   Soft and nontender. Non distended. There is no CVA  tenderness.  No rebound, rigidity, or guarding. Genitourinary:   deferred Musculoskeletal:   Nontender with normal range of motion in all extremities. No joint effusions.  No lower extremity tenderness.  No edema. Neurologic:   Normal speech and language. Notably confused CN 2-10 normal. Motor grossly intact. No gross focal neurologic deficits are appreciated.  Skin:    Skin is warm, dry and intact. No rash noted.  No petechiae, purpura, or bullae.  ____________________________________________    LABS (pertinent positives/negatives) (all labs ordered are listed, but only abnormal results are displayed) Labs Reviewed  URINALYSIS COMPLETEWITH MICROSCOPIC ([facility] ONLY) - Abnormal; Notable for the following:    Color, Urine STRAW (*)    APPearance CLEAR (*)    Specific Gravity, Urine 1.001 (*)    Leukocytes, UA 1+ (*)    Bacteria, UA RARE (*)    Squamous Epithelial / LPF 0-5 (*)    All other components within normal limits   ____________________________________________   EKG    ____________________________________________    RADIOLOGY  CT head unremarkable  ____________________________________________   PROCEDURES   ____________________________________________   INITIAL IMPRESSION / ASSESSMENT AND PLAN / ED COURSE  Pertinent labs & imaging results that were available during my care of the patient were reviewed by me and considered in my medical decision making (see chart for details).  Patient well appearing no acute distress, likely a chronic mental status baseline. CT head unremarkable, no wound care needed. I'll have a urinary tract infection, given fosfomycin 3 g dose here. Urine culture sent as well. [facility] , follow up with primary care. Low suspicion for any acute musculoskeletal injuries.     ____________________________________________   FINAL CLINICAL IMPRESSION(S) / ED DIAGNOSES  Final diagnoses:  Fall  Cystitis  Scalp  contusion, initial encounter       Portions of this note were generated with dragon dictation software. Dictation errors may occur despite best attempts at proofreading.   Sharman CheekPhillip Salbador Fiveash, MD 12/29/15 1420

## 2015-12-29 NOTE — ED Notes (Signed)
[facility] for a fall from her wheel-chair, patient has hematoma to the center of her forehead.

## 2015-12-29 NOTE — Discharge Instructions (Signed)
You were seen in the ER today after a fall and head injury. Your CT scan of the head is unremarkable and does not show any intracranial bleeding. You were found to have a urinary tract infection and given fosfomycin to treat this. We also sent a urine culture to the lab to follow-up on this. Please see your doctor this week to continue monitoring your  Urinary Tract Infection Urinary tract infections (UTIs) can develop anywhere along your urinary tract. Your urinary tract is your body's drainage system for removing wastes and extra water. Your urinary tract includes two kidneys, two ureters, a bladder, and a urethra. Your kidneys are a pair of bean-shaped organs. Each kidney is about the size of your fist. They are located below your ribs, one on each side of your spine. CAUSES Infections are caused by microbes, which are microscopic organisms, including fungi, viruses, and bacteria. These organisms are so small that they can only be seen through a microscope. Bacteria are the microbes that most commonly cause UTIs. SYMPTOMS  Symptoms of UTIs may vary by age and gender of the patient and by the location of the infection. Symptoms in young women typically include a frequent and intense urge to urinate and a painful, burning feeling in the bladder or urethra during urination. Older women and men are more likely to be tired, shaky, and weak and have muscle aches and abdominal pain. A fever may mean the infection is in your kidneys. Other symptoms of a kidney infection include pain in your back or sides below the ribs, nausea, and vomiting. DIAGNOSIS To diagnose a UTI, your caregiver will ask you about your symptoms. Your caregiver will also ask you to provide a urine sample. The urine sample will be tested for bacteria and white blood cells. White blood cells are made by your body to help fight infection. TREATMENT  Typically, UTIs can be treated with medication. Because most UTIs are caused by a bacterial  infection, they usually can be treated with the use of antibiotics. The choice of antibiotic and length of treatment depend on your symptoms and the type of bacteria causing your infection. HOME CARE INSTRUCTIONS  If you were prescribed antibiotics, take them exactly as your caregiver instructs you. Finish the medication even if you feel better after you have only taken some of the medication.  Drink enough water and fluids to keep your urine clear or pale yellow.  Avoid caffeine, tea, and carbonated beverages. They tend to irritate your bladder.  Empty your bladder often. Avoid holding urine for long periods of time.  Empty your bladder before and after sexual intercourse.  After a bowel movement, women should cleanse from front to back. Use each tissue only once. SEEK MEDICAL CARE IF:   You have back pain.  You develop a fever.  Your symptoms do not begin to resolve within 3 days. SEEK IMMEDIATE MEDICAL CARE IF:   You have severe back pain or lower abdominal pain.  You develop chills.  You have nausea or vomiting.  You have continued burning or discomfort with urination. MAKE SURE YOU:   Understand these instructions.  Will watch your condition.  Will get help right away if you are not doing well or get worse.   This information is not intended to replace advice given to you by your health care provider. Make sure you discuss any questions you have with your health care provider.   Document Released: 05/29/2005 Document Revised: 05/10/2015 Document Reviewed: 09/27/2011 Elsevier  Interactive Patient Education [employer] symptoms.  Facial or Scalp Contusion A facial or scalp contusion is a deep bruise on the face or head. Injuries to the face and head generally cause a lot of swelling, especially around the eyes. Contusions are the result of an injury that caused bleeding under the skin. The contusion may turn blue, purple, or yellow. Minor injuries will give you  a painless contusion, but more severe contusions may stay painful and swollen for a few weeks.  CAUSES  A facial or scalp contusion is caused by a blunt injury or trauma to the face or head area.  SIGNS AND SYMPTOMS   Swelling of the injured area.   Discoloration of the injured area.   Tenderness, soreness, or pain in the injured area.  DIAGNOSIS  The diagnosis can be made by taking a medical history and doing a physical exam. An X-ray exam, CT scan, or MRI may be needed to determine if there are any associated injuries, such as broken bones (fractures). TREATMENT  Often, the best treatment for a facial or scalp contusion is applying cold compresses to the injured area. Over-the-counter medicines may also be recommended for pain control.  HOME CARE INSTRUCTIONS   Only take over-the-counter or prescription medicines as directed by your health care provider.   Apply ice to the injured area.   Put ice in a plastic bag.   Place a towel between your skin and the bag.   Leave the ice on for 20 minutes, 2-3 times a day.  SEEK MEDICAL CARE IF:  You have bite problems.   You have pain with chewing.   You are concerned about facial defects. SEEK IMMEDIATE MEDICAL CARE IF:  You have severe pain or a headache that is not relieved by medicine.   You have unusual sleepiness, confusion, or personality changes.   You throw up (vomit).   You have a persistent nosebleed.   You have double vision or blurred vision.   You have fluid drainage from your nose or ear.   You have difficulty walking or using your arms or legs.  MAKE SURE YOU:   Understand these instructions.  Will watch your condition.  Will get help right away if you are not doing well or get worse.   This information is not intended to replace advice given to you by your health care provider. Make sure you discuss any questions you have with your health care provider.   Document Released: 09/26/2004  Document Revised: 09/09/2014 Document Reviewed: 04/01/2013 Elsevier Interactive Patient Education [employer]

## 2015-12-29 NOTE — ED Notes (Signed)
Per Clydie BraunKaren, patient will need to be sent back by EMS.

## 2015-12-29 NOTE — ED Notes (Signed)
Spoke with [facility] and gave report on patient.

## 2015-12-29 NOTE — ED Notes (Signed)
Spoke with Ray and informed him that patient was here, per Ray his sister-in-law, Lyn HenriJoyce Dunn, is supposed to be coming to check on patient.

## 2015-12-29 NOTE — ED Notes (Signed)
Patient requesting that we call her son, Rosalia HammersRay.

## 2015-12-31 LAB — URINE CULTURE: CULTURE: NO GROWTH

## 2016-02-26 ENCOUNTER — Encounter: Payer: Self-pay | Admitting: Emergency Medicine

## 2016-02-26 ENCOUNTER — Emergency Department
Admission: EM | Admit: 2016-02-26 | Discharge: 2016-02-26 | Disposition: A | Discharge status: Home / Self Care | Payer: Medicare Other | Attending: Emergency Medicine | Admitting: Emergency Medicine

## 2016-02-26 ENCOUNTER — Emergency Department: Payer: Medicare Other

## 2016-02-26 ENCOUNTER — Other Ambulatory Visit: Payer: Self-pay

## 2016-02-26 DIAGNOSIS — E785 Hyperlipidemia, unspecified: Secondary | ICD-10-CM | POA: Diagnosis not present

## 2016-02-26 DIAGNOSIS — Y939 Activity, unspecified: Secondary | ICD-10-CM | POA: Diagnosis not present

## 2016-02-26 DIAGNOSIS — Y999 Unspecified external cause status: Secondary | ICD-10-CM | POA: Diagnosis not present

## 2016-02-26 DIAGNOSIS — S8991XA Unspecified injury of right lower leg, initial encounter: Secondary | ICD-10-CM | POA: Diagnosis present

## 2016-02-26 DIAGNOSIS — Z7982 Long term (current) use of aspirin: Secondary | ICD-10-CM | POA: Diagnosis not present

## 2016-02-26 DIAGNOSIS — S8001XA Contusion of right knee, initial encounter: Secondary | ICD-10-CM | POA: Insufficient documentation

## 2016-02-26 DIAGNOSIS — Z79899 Other long term (current) drug therapy: Secondary | ICD-10-CM | POA: Diagnosis not present

## 2016-02-26 DIAGNOSIS — W050XXA Fall from non-moving wheelchair, initial encounter: Secondary | ICD-10-CM | POA: Diagnosis not present

## 2016-02-26 DIAGNOSIS — S0990XA Unspecified injury of head, initial encounter: Secondary | ICD-10-CM | POA: Diagnosis not present

## 2016-02-26 DIAGNOSIS — I10 Essential (primary) hypertension: Secondary | ICD-10-CM | POA: Insufficient documentation

## 2016-02-26 DIAGNOSIS — Y92129 Unspecified place in nursing home as the place of occurrence of the external cause: Secondary | ICD-10-CM | POA: Diagnosis not present

## 2016-02-26 MED ORDER — ACETAMINOPHEN 500 MG PO TABS
1000.0000 mg | ORAL_TABLET | Freq: Once | ORAL | Status: AC
  Administered 2016-02-26: 1000 mg via ORAL
  Filled 2016-02-26: qty 2

## 2016-02-26 NOTE — Discharge Instructions (Signed)
Head Injury, Adult You have a head injury. Headaches and throwing up (vomiting) are common after a head injury. It should be easy to wake up from sleeping. Sometimes you must stay in the hospital. Most problems happen within the first 24 hours. Side effects may occur up to 7-10 days after the injury.  WHAT ARE THE TYPES OF HEAD INJURIES? Head injuries can be as minor as a bump. Some head injuries can be more severe. More severe head injuries include:  A jarring injury to the brain (concussion).  A bruise of the brain (contusion). This mean there is bleeding in the brain that can cause swelling.  A cracked skull (skull fracture).  Bleeding in the brain that collects, clots, and forms a bump (hematoma). WHEN SHOULD I GET HELP RIGHT AWAY?   You are confused or sleepy.  You cannot be woken up.  You feel sick to your stomach (nauseous) or keep throwing up (vomiting).  Your dizziness or unsteadiness is getting worse.  You have very bad, lasting headaches that are not helped by medicine. Take medicines only as told by your doctor.  You cannot use your arms or legs like normal.  You cannot walk.  You notice changes in the black spots in the center of the colored part of your eye (pupil).  You have clear or bloody fluid coming from your nose or ears.  You have trouble seeing. During the next 24 hours after the injury, you must stay with someone who can watch you. This person should get help right away (call 911 in the [place] if you start to shake and are not able to control it (have seizures), you pass out, or you are unable to wake up. HOW CAN I PREVENT A HEAD INJURY IN THE FUTURE?  Wear seat belts.  Wear a helmet while bike riding and playing sports like football.  Stay away from dangerous activities around the house. WHEN CAN I RETURN TO NORMAL ACTIVITIES AND ATHLETICS? See your doctor before doing these activities. You should not do normal activities or play contact sports until 1  week after the following symptoms have stopped:  Headache that does not go away.  Dizziness.  Poor attention.  Confusion.  Memory problems.  Sickness to your stomach or throwing up.  Tiredness.  Fussiness.  Bothered by bright lights or loud noises.  Anxiousness or depression.  Restless sleep. MAKE SURE YOU:   Understand these instructions.  Will watch your condition.  Will get help right away if you are not doing well or get worse.   This information is not intended to replace advice given to you by your health care provider. Make sure you discuss any questions you have with your health care provider.   Document Released: 08/01/2008 Document Revised: 09/09/2014 Document Reviewed: 04/26/2013 Elsevier Interactive Patient Education [employer]  Please return immediately if condition worsens. Please contact her primary physician or the physician you were given for referral. If you have any specialist physicians involved in her treatment and plan please also contact them. Thank you for using [facility]. X-rays of the right knee, CAT scan of the head and face and cervical spine show no fractures or significant abnormality. Continue current medication. Ice to areas of swelling. Tylenol for pain.

## 2016-02-26 NOTE — ED Notes (Signed)
Multiple additional attempts made to call report to [facility] without anyone picking up the phone. Charge aware.

## 2016-02-26 NOTE — ED Notes (Signed)
Pt [facility] after an unwitnessed fall from her wheelchair. Pt thinks she slid out of the wheelchair landing on her knees then hitting her head. Pt denies LOC, dizziness, headache, N/V. Denies neck pain, no tenderness to palpation. Pt has large amount of swelling and bruising below left eye and some swelling above left eye, bruising and swelling to the left knee, bruising and swelling to right hand and some bruising to right knee. Denies any hip pain, no tenderness to palpation of bilateral hips or pelvis. [facility], poor historian.

## 2016-02-26 NOTE — ED Notes (Signed)
Discharge instructions reviewed with patient's son and daughter-in law since RN was unable to get in touch with facility. Family verbalized understanding. Discharge instructions sent back to facility with EMS. [facility].

## 2016-02-26 NOTE — ED Notes (Signed)
Multiple attempts (over 15) made to call report to [facility] since patient is being discharged back to facility. Both numbers that we have on record for facility have been tried. No one ever answered phone, ringing would just cut off without the phone ever being picked up. Will continue to try. Secretary also attempting to call facility.

## 2016-02-26 NOTE — ED Provider Notes (Signed)
Time Seen: Approximately 1940 I have reviewed the triage notes  Chief Complaint: Fall   History of Present Illness: Alice Manning is a 82 y.o. female who presents after being found at the nursing facility for an unwitnessed fall from her wheelchair. The patient has a history of mild cognitive impairment but is able to answer most questions appropriately. She states she thinks she slid out of her wheelchair and landed primarily on her knees and then fell forward to her head. She denies any loss of consciousness, neck pain, chest pain or shortness of breath. She denies any abdominal pain. She has had previous trauma to the left shoulder region.   Past Medical History  Diagnosis Date  . Hyperlipemia   . Hypertension   . Mild cognitive impairment     There are no active problems to display for this patient.   History reviewed. No pertinent past surgical history.  History reviewed. No pertinent past surgical history.  Current Outpatient Rx  Name  Route  Sig  Dispense  Refill  . acetaminophen (TYLENOL) 500 MG tablet   Oral   Take 1,000 mg by mouth 3 (three) times daily.         Marland Kitchen aspirin 81 MG tablet   Oral   Take 81 mg by mouth daily.         . Cranberry 450 MG CAPS   Oral   Take 1 capsule by mouth daily.         Marland Kitchen diltiazem (TIAZAC) 360 MG 24 hr capsule   Oral   Take 360 mg by mouth daily.         Marland Kitchen escitalopram (LEXAPRO) 20 MG tablet   Oral   Take 20 mg by mouth daily.         . furosemide (LASIX) 40 MG tablet   Oral   Take 40 mg by mouth daily.          Marland Kitchen levothyroxine (SYNTHROID, LEVOTHROID) 75 MCG tablet   Oral   Take 75 mcg by mouth daily before breakfast.         . lisinopril (PRINIVIL,ZESTRIL) 40 MG tablet   Oral   Take 40 mg by mouth daily.         . memantine (NAMENDA) 10 MG tablet   Oral   Take 10 mg by mouth 2 (two) times daily.         . Menthol (ICY HOT) 5 % PTCH   Apply externally   Apply 1 patch topically daily.          . vitamin B-12 (CYANOCOBALAMIN) 1000 MCG tablet   Oral   Take 1,000 mcg by mouth daily.           Allergies:  Review of patient's allergies indicates no known allergies.  Family History: History reviewed. No pertinent family history.  Social History: Social History  Substance Use Topics  . Smoking status: Never Smoker   . Smokeless tobacco: None  . Alcohol Use: No     Review of Systems:   10 point review of systems was performed and was otherwise negative:  Constitutional: No fever Eyes: No visual disturbances ENT: No sore throat, ear pain Cardiac: No chest pain Respiratory: No shortness of breath, wheezing, or stridor Abdomen: No abdominal pain, no vomiting, No diarrhea Endocrine: No weight loss, No night sweats Extremities: No peripheral edema, cyanosis Skin: No rashes, easy bruising Neurologic: No focal weakness, trouble with speech or swollowing Urologic: No dysuria, Hematuria,  or urinary frequency   Physical Exam:  ED Triage Vitals  Enc Vitals Group     BP 02/26/16 1916 184/73 mmHg     Pulse Rate 02/26/16 1916 81     Resp 02/26/16 1916 17     Temp 02/26/16 1916 98.1 F (36.7 C)     Temp Source 02/26/16 1916 Oral     SpO2 02/26/16 1916 100 %     Weight 02/26/16 1916 110 lb (49.896 kg)     Height 02/26/16 1916 5\' 3"  (1.6 m)     Head Cir --      Peak Flow --      Pain Score --      Pain Loc --      Pain Edu? --      Excl. in GC? --     General: Awake , Alert , and Oriented times2, Glasgow Coma Scale 15. Head: Normal cephalic , patient has an ecchymotic area with swelling around the left eye. The orbit itself shows no crepitus and the patient's extraocular eye movements are intact. Eyes: Pupils equal , round, reactive to light. Clear conjunctiva Nose/Throat: No nasal drainage, patent upper airway without erythema or exudate. Midface is stable Neck: Supple, Full range of motion, No anterior adenopathy or palpable thyroid masses. No midline  cervical neck pain with palpation without crepitus or step-off noted. Good flexion extension and rotation without any pain or neuropraxia Lungs: Clear to ascultation without wheezes , rhonchi, or rales Heart: Regular rate, regular rhythm without murmurs , gallops , or rubs Abdomen: Soft, non tender without rebound, guarding , or rigidity; bowel sounds positive and symmetric in all 4 quadrants. No organomegaly .        Extremities: Previous knee surgery on the left knee with swelling primarily over the patella. No crepitus or step-off is noted. Mild ecchymosis over the right knee but again no crepitus or step-off noted. All extremities are neurovascularly intact. She has a previous splint on her left upper extremity from up previous injury Neurologic: normal ambulation, Motor symmetric without deficits, sensory intact Skin: warm, dry, no rashes   LEKG: * ED ECG REPORT I, Jennye MoccasinBrian S Quigley, the attending physician, personally viewed and interpreted this ECG.  Date: 02/26/2016 EKG Time: 1911 Rate: 83. Rhythm: normal sinus rhythm QRS Axis: normal Intervals: normal ST/T Wave abnormalities: normal Conduction Disturbances: none Narrative Interpretation: unremarkable    Radiology     CT HEAD WO CONTRAST (Final result) Result time: 02/26/16 20:08:16   Final result by Rad Results In Interface (02/26/16 20:08:16)   Narrative:   CLINICAL DATA: Unwitnessed fall from wheelchair, striking the head. Swelling and bruising around the left via. Dementia.  EXAM: CT HEAD WITHOUT CONTRAST  CT MAXILLOFACIAL WITHOUT CONTRAST  CT CERVICAL SPINE WITHOUT CONTRAST  TECHNIQUE: Multidetector CT imaging of the head, cervical spine, and maxillofacial structures were performed using the standard protocol without intravenous contrast. Multiplanar CT image reconstructions of the cervical spine and maxillofacial structures were also generated.  COMPARISON: 12/29/2015  FINDINGS: CT HEAD  FINDINGS  The brainstem, cerebellum, cerebral peduncles, thalami, basal ganglia, basilar cisterns, and ventricular system appear within normal limits. Periventricular white matter and corona radiata hypodensities favor chronic ischemic microvascular white matter disease. No intracranial hemorrhage, mass lesion, or acute CVA.  CT MAXILLOFACIAL FINDINGS  Left periorbital hematoma also extends anterior to the left maxilla, without associated fracture noted.  There is minimal chronic ethmoid sinusitis. There is some degenerative thinning of articular cartilage in both temporomandibular joints.  No intraorbital abnormality is observed. The globes appear symmetric. There is some subcutaneous hematoma along the left facial soft tissue swelling.  CT CERVICAL SPINE FINDINGS  2 mm of grade 1 anterolisthesis at C4-5 attributable to the degenerative facet arthropathy, especially on the left at this level. Multilevel spondylosis and evidence of degenerative disc disease. No cervical spine fracture or compelling findings of acute subluxation.  There is some faint calcification in the transverse ligament at C1. Uncinate and facet spurring cause right foraminal stenosis at the C5-6 level, and left foraminal stenosis at C3-4, C4-5, and C5-6. No significant prevertebral soft tissue swelling. There is scarring along the lung apices, left greater than right.  IMPRESSION: 1. Left facial subcutaneous hematoma and periorbital soft tissue swelling. The hematomas primarily along the anterolateral margin of the maxilla. No facial fracture. 2. No acute intracranial findings. Periventricular white matter and corona radiata hypodensities favor chronic ischemic microvascular white matter disease. 3. Cervical spondylosis causes foraminal impingement at C3-4, C4-5, and C5-6. No fracture or acute subluxation identified.   Electronically Signed By: Gaylyn RongWalter Liebkemann M.D. On: 02/26/2016 20:08           CT Cervical Spine Wo Contrast (Final result) Result time: 02/26/16 20:08:16   Final result by Rad Results In Interface (02/26/16 20:08:16)   Narrative:   CLINICAL DATA: Unwitnessed fall from wheelchair, striking the head. Swelling and bruising around the left via. Dementia.  EXAM: CT HEAD WITHOUT CONTRAST  CT MAXILLOFACIAL WITHOUT CONTRAST  CT CERVICAL SPINE WITHOUT CONTRAST  TECHNIQUE: Multidetector CT imaging of the head, cervical spine, and maxillofacial structures were performed using the standard protocol without intravenous contrast. Multiplanar CT image reconstructions of the cervical spine and maxillofacial structures were also generated.  COMPARISON: 12/29/2015  FINDINGS: CT HEAD FINDINGS  The brainstem, cerebellum, cerebral peduncles, thalami, basal ganglia, basilar cisterns, and ventricular system appear within normal limits. Periventricular white matter and corona radiata hypodensities favor chronic ischemic microvascular white matter disease. No intracranial hemorrhage, mass lesion, or acute CVA.  CT MAXILLOFACIAL FINDINGS  Left periorbital hematoma also extends anterior to the left maxilla, without associated fracture noted.  There is minimal chronic ethmoid sinusitis. There is some degenerative thinning of articular cartilage in both temporomandibular joints.  No intraorbital abnormality is observed. The globes appear symmetric. There is some subcutaneous hematoma along the left facial soft tissue swelling.  CT CERVICAL SPINE FINDINGS  2 mm of grade 1 anterolisthesis at C4-5 attributable to the degenerative facet arthropathy, especially on the left at this level. Multilevel spondylosis and evidence of degenerative disc disease. No cervical spine fracture or compelling findings of acute subluxation.  There is some faint calcification in the transverse ligament at C1. Uncinate and facet spurring cause right foraminal stenosis at  the C5-6 level, and left foraminal stenosis at C3-4, C4-5, and C5-6. No significant prevertebral soft tissue swelling. There is scarring along the lung apices, left greater than right.  IMPRESSION: 1. Left facial subcutaneous hematoma and periorbital soft tissue swelling. The hematomas primarily along the anterolateral margin of the maxilla. No facial fracture. 2. No acute intracranial findings. Periventricular white matter and corona radiata hypodensities favor chronic ischemic microvascular white matter disease. 3. Cervical spondylosis causes foraminal impingement at C3-4, C4-5, and C5-6. No fracture or acute subluxation identified.   Electronically Signed By: Gaylyn RongWalter Liebkemann M.D. On: 02/26/2016 20:08          CT MAXILLOFACIAL WO CONTRAST (Final result) Result time: 02/26/16 20:08:16   Final result by Rad Results In Interface (02/26/16  20:08:16)   Narrative:   CLINICAL DATA: Unwitnessed fall from wheelchair, striking the head. Swelling and bruising around the left via. Dementia.  EXAM: CT HEAD WITHOUT CONTRAST  CT MAXILLOFACIAL WITHOUT CONTRAST  CT CERVICAL SPINE WITHOUT CONTRAST  TECHNIQUE: Multidetector CT imaging of the head, cervical spine, and maxillofacial structures were performed using the standard protocol without intravenous contrast. Multiplanar CT image reconstructions of the cervical spine and maxillofacial structures were also generated.  COMPARISON: 12/29/2015  FINDINGS: CT HEAD FINDINGS  The brainstem, cerebellum, cerebral peduncles, thalami, basal ganglia, basilar cisterns, and ventricular system appear within normal limits. Periventricular white matter and corona radiata hypodensities favor chronic ischemic microvascular white matter disease. No intracranial hemorrhage, mass lesion, or acute CVA.  CT MAXILLOFACIAL FINDINGS  Left periorbital hematoma also extends anterior to the left maxilla, without associated fracture  noted.  There is minimal chronic ethmoid sinusitis. There is some degenerative thinning of articular cartilage in both temporomandibular joints.  No intraorbital abnormality is observed. The globes appear symmetric. There is some subcutaneous hematoma along the left facial soft tissue swelling.  CT CERVICAL SPINE FINDINGS  2 mm of grade 1 anterolisthesis at C4-5 attributable to the degenerative facet arthropathy, especially on the left at this level. Multilevel spondylosis and evidence of degenerative disc disease. No cervical spine fracture or compelling findings of acute subluxation.  There is some faint calcification in the transverse ligament at C1. Uncinate and facet spurring cause right foraminal stenosis at the C5-6 level, and left foraminal stenosis at C3-4, C4-5, and C5-6. No significant prevertebral soft tissue swelling. There is scarring along the lung apices, left greater than right.  IMPRESSION: 1. Left facial subcutaneous hematoma and periorbital soft tissue swelling. The hematomas primarily along the anterolateral margin of the maxilla. No facial fracture. 2. No acute intracranial findings. Periventricular white matter and corona radiata hypodensities favor chronic ischemic microvascular white matter disease. 3. Cervical spondylosis causes foraminal impingement at C3-4, C4-5, and C5-6. No fracture or acute subluxation identified.   Electronically Signed By: Gaylyn Rong M.D. On: 02/26/2016 20:08          DG Knee 2 Views Left (Final result) Result time: 02/26/16 19:52:30   Final result by Rad Results In Interface (02/26/16 19:52:30)   Narrative:   CLINICAL DATA: Unwitnessed fall from wheelchair landing on knees and hitting head. Left knee bruising and swelling.  EXAM: LEFT KNEE - 1-2 VIEW  COMPARISON: None.  FINDINGS: There is diffuse decreased bone mineralization. There is a lateral fixation plate with screws over the proximal  tibia intact. No acute fracture or dislocation. No significant joint effusion. Atherosclerotic disease. Mild to moderate tricompartmental osteoarthritic change.  IMPRESSION: No acute findings.   Electronically Signed By: Elberta Fortis M.D. On: 02/26/2016 19:52              I personally reviewed the radiologic studies     ED Course: * Patient's stay here was uneventful and she has significant ecchymosis and swelling no bony or intracranial injury could be noted by CT and x-ray evaluation. Otherwise hemodynamically stable. No felt the patient could be transported back to the facility with instructions she will likely require BLS transport    Assessment:  Acute closed head injury Left knee contusion   Final Clinical Impression:   Final diagnoses:  Head trauma, initial encounter     Plan:  Outpatient management Patient was advised to return immediately if condition worsens. Patient was advised to follow up with their primary care physician or other specialized  physicians involved in their outpatient care. The patient and/or family member/power of attorney had laboratory results reviewed at the bedside. All questions and concerns were addressed and appropriate discharge instructions were distributed by the nursing staff.             Jennye Moccasin, MD 02/26/16 2042

## 2016-03-03 ENCOUNTER — Encounter: Payer: Self-pay | Admitting: Emergency Medicine

## 2016-03-03 ENCOUNTER — Emergency Department
Admission: EM | Admit: 2016-03-03 | Discharge: 2016-03-03 | Disposition: A | Discharge status: Home / Self Care | Payer: Medicare Other | Attending: Emergency Medicine | Admitting: Emergency Medicine

## 2016-03-03 ENCOUNTER — Emergency Department: Payer: Medicare Other

## 2016-03-03 DIAGNOSIS — Y9289 Other specified places as the place of occurrence of the external cause: Secondary | ICD-10-CM | POA: Diagnosis not present

## 2016-03-03 DIAGNOSIS — Y939 Activity, unspecified: Secondary | ICD-10-CM | POA: Diagnosis not present

## 2016-03-03 DIAGNOSIS — S0990XA Unspecified injury of head, initial encounter: Secondary | ICD-10-CM | POA: Diagnosis present

## 2016-03-03 DIAGNOSIS — I10 Essential (primary) hypertension: Secondary | ICD-10-CM | POA: Diagnosis not present

## 2016-03-03 DIAGNOSIS — W19XXXA Unspecified fall, initial encounter: Secondary | ICD-10-CM | POA: Diagnosis not present

## 2016-03-03 DIAGNOSIS — Y999 Unspecified external cause status: Secondary | ICD-10-CM | POA: Diagnosis not present

## 2016-03-03 DIAGNOSIS — Z7982 Long term (current) use of aspirin: Secondary | ICD-10-CM | POA: Insufficient documentation

## 2016-03-03 DIAGNOSIS — E785 Hyperlipidemia, unspecified: Secondary | ICD-10-CM | POA: Insufficient documentation

## 2016-03-03 LAB — BASIC METABOLIC PANEL
ANION GAP: 7 (ref 5–15)
BUN: 31 mg/dL — ABNORMAL HIGH (ref 6–20)
CHLORIDE: 105 mmol/L (ref 101–111)
CO2: 24 mmol/L (ref 22–32)
Calcium: 9.5 mg/dL (ref 8.9–10.3)
Creatinine, Ser: 1.13 mg/dL — ABNORMAL HIGH (ref 0.44–1.00)
GFR calc non Af Amer: 42 mL/min — ABNORMAL LOW (ref >60)
GFR, EST AFRICAN AMERICAN: 49 mL/min — AB (ref >60)
Glucose, Bld: 141 mg/dL — ABNORMAL HIGH (ref 65–99)
Potassium: 4.3 mmol/L (ref 3.5–5.1)
Sodium: 136 mmol/L (ref 135–145)

## 2016-03-03 LAB — CBC WITH DIFFERENTIAL/PLATELET
BASOS ABS: 0.1 10*3/uL (ref 0–0.1)
BASOS PCT: 1 %
Eosinophils Absolute: 0.1 10*3/uL (ref 0–0.7)
Eosinophils Relative: 2 %
HEMATOCRIT: 34.6 % — AB (ref 35.0–47.0)
HEMOGLOBIN: 11.8 g/dL — AB (ref 12.0–16.0)
Lymphocytes Relative: 26 %
Lymphs Abs: 1.4 10*3/uL (ref 1.0–3.6)
MCH: 33.1 pg (ref 26.0–34.0)
MCHC: 34 g/dL (ref 32.0–36.0)
MCV: 97.6 fL (ref 80.0–100.0)
MONOS PCT: 11 %
Monocytes Absolute: 0.6 10*3/uL (ref 0.2–0.9)
NEUTROS ABS: 3.2 10*3/uL (ref 1.4–6.5)
NEUTROS PCT: 60 %
Platelets: 255 10*3/uL (ref 150–440)
RBC: 3.55 MIL/uL — ABNORMAL LOW (ref 3.80–5.20)
RDW: 12.9 % (ref 11.5–14.5)
WBC: 5.3 10*3/uL (ref 3.6–11.0)

## 2016-03-03 MED ORDER — ACETAMINOPHEN 325 MG PO TABS
650.0000 mg | ORAL_TABLET | Freq: Once | ORAL | Status: DC
  Filled 2016-03-03: qty 2

## 2016-03-03 NOTE — ED Notes (Signed)
Medical necessity entered in record and printed. Secretary aware of transportation need.

## 2016-03-03 NOTE — ED Provider Notes (Signed)
Time Seen: Approximately *1600  I have reviewed the triage notes  Chief Complaint: Fall   History of Present Illness: Alice Manning is a 82 y.o. female who presents after another fall. Patient's been here recently and now is evaluating the patient on June 26 for a non-syncopal fall. Patient was felt to have fallen out of a wheelchair at that time. She had head injury and landed primarily on her knees. Today was an unwitnessed fall with new bruising on the left side of her forehead. There is no information about loss of consciousness etc. Patient denies any chest or abdominal pain. Patient denies any back or flank discomfort.   Past Medical History  Diagnosis Date  . Hyperlipemia   . Hypertension   . Mild cognitive impairment     There are no active problems to display for this patient.   History reviewed. No pertinent past surgical history.  History reviewed. No pertinent past surgical history.  Current Outpatient Rx  Name  Route  Sig  Dispense  Refill  . acetaminophen (TYLENOL) 500 MG tablet   Oral   Take 1,000 mg by mouth 3 (three) times daily.         Marland Kitchen. aspirin 81 MG tablet   Oral   Take 81 mg by mouth daily.         . Cranberry 450 MG CAPS   Oral   Take 1 capsule by mouth daily.         Marland Kitchen. diltiazem (TIAZAC) 360 MG 24 hr capsule   Oral   Take 360 mg by mouth daily.         Marland Kitchen. escitalopram (LEXAPRO) 20 MG tablet   Oral   Take 20 mg by mouth daily.         . furosemide (LASIX) 40 MG tablet   Oral   Take 40 mg by mouth daily.          Marland Kitchen. levothyroxine (SYNTHROID, LEVOTHROID) 75 MCG tablet   Oral   Take 75 mcg by mouth daily before breakfast.         . lisinopril (PRINIVIL,ZESTRIL) 40 MG tablet   Oral   Take 40 mg by mouth daily.         . memantine (NAMENDA) 10 MG tablet   Oral   Take 10 mg by mouth 2 (two) times daily.         . Menthol (ICY HOT) 5 % PTCH   Apply externally   Apply 1 patch topically daily.         . vitamin  B-12 (CYANOCOBALAMIN) 1000 MCG tablet   Oral   Take 1,000 mcg by mouth daily.           Allergies:  Review of patient's allergies indicates no known allergies.  Family History: History reviewed. No pertinent family history.  Social History: Social History  Substance Use Topics  . Smoking status: Never Smoker   . Smokeless tobacco: None  . Alcohol Use: No     Review of Systems:   10 point review of systems was performed and was otherwise negative: Patient's of medical records primarily come from the old chart and EMS and nursing notes Constitutional: No fever Eyes: No visual disturbances ENT: No sore throat, ear pain Cardiac: No chest pain Respiratory: No shortness of breath, wheezing, or stridor Abdomen: No abdominal pain, no vomiting, No diarrhea Endocrine: No weight loss, No night sweats Extremities: No peripheral edema, cyanosis Skin: No rashes, easy bruising  Neurologic: No focal weakness, trouble with speech or swollowing Urologic: No dysuria, Hematuria, or urinary frequency *  Physical Exam:  ED Triage Vitals  Enc Vitals Group     BP 03/03/16 1549 105/50 mmHg     Pulse --      Resp 03/03/16 1549 16     Temp 03/03/16 1549 98.1 F (36.7 C)     Temp Source 03/03/16 1549 Oral     SpO2 03/03/16 1549 100 %     Weight 03/03/16 1549 110 lb (49.896 kg)     Height --      Head Cir --      Peak Flow --      Pain Score 03/03/16 1550 5     Pain Loc --      Pain Edu? --      Excl. in GC? --     General: Awake , Alert , and Oriented times 2, Glasgow Coma Scale 15 Head: Normal cephalic she has previous trauma contusions left maxillary area with bruising extending down left side of her neck. Fresh bruise over the top part left side of the forehead.  Eyes: Pupils equal , round, reactive to light Nose/Throat: No nasal drainage, patent upper airway without erythema or exudate.  Neck: Supple, Full range of motion, No anterior adenopathy or palpable thyroid  masses Lungs: Clear to ascultation without wheezes , rhonchi, or rales Heart: Regular rate, regular rhythm without murmurs , gallops , or rubs Abdomen: Soft, non tender without rebound, guarding , or rigidity; bowel sounds positive and symmetric in all 4 quadrants. No organomegaly .        Extremities: Old bruises with previous surgery and some tenderness over the left knee (previously x-ray) Neurologic: normal ambulation, Motor symmetric without deficits, sensory intact Skin: warm, dry, no rashes   Labs:   All laboratory work was reviewed including any pertinent negatives or positives listed below:  Labs Reviewed  CBC WITH DIFFERENTIAL/PLATELET  BASIC METABOLIC PANEL  Laboratory work was reviewed and showed no clinically significant abnormalities.   EKG: ED ECG REPORT I, Jennye Moccasin, the attending physician, personally viewed and interpreted this ECG.  Date: 03/03/2016 EKG Time: 1552 Rate: *64 Rhythm: normal sinus rhythm QRS Axis: normal Intervals: normal ST/T Wave abnormalities: normal Conduction Disturbances: none Narrative Interpretation: unremarkable No acute ischemic changes   Radiology:   CT HEAD WO CONTRAST (Final result) Result time: 03/03/16 16:47:55   Final result by Rad Results In Interface (03/03/16 16:47:55)   Narrative:   CLINICAL DATA: Unwitnessed fall.  EXAM: CT HEAD WITHOUT CONTRAST  CT CERVICAL SPINE WITHOUT CONTRAST  TECHNIQUE: Multidetector CT imaging of the head and cervical spine was performed following the standard protocol without intravenous contrast. Multiplanar CT image reconstructions of the cervical spine were also generated.  COMPARISON: CT 02/26/2016  FINDINGS: CT HEAD FINDINGS  Moderate to advanced atrophy. Chronic microvascular ischemic changes. Negative for acute infarct. Negative for intracranial hemorrhage or mass lesion.  Left frontal scalp hematoma. Negative for skull fracture.  CT CERVICAL SPINE  FINDINGS  3 mm anterior listhesis C4-5 is unchanged. 3 mm anterior listhesis C7-T1 also unchanged. Slip is due degenerative due to facet arthritis. Disc degeneration and spondylosis at C5-6. Mild anterior slip C6-7 unchanged. Diffuse facet degeneration on the left. Mild dextroscoliosis.  Negative for cervical spine fracture.  IMPRESSION: Atrophy and chronic microvascular ischemia. No acute intracranial abnormality  Left frontal scalp hematoma  Cervical degenerative changes. Negative for fracture.   Electronically Signed By: Leonette Most  Chestine Sporelark M.D. On: 03/03/2016 16:47          CT Cervical Spine Wo Contrast (Final result) Result time: 03/03/16 16:47:55   Final result by Rad Results In Interface (03/03/16 16:47:55)   Narrative:   CLINICAL DATA: Unwitnessed fall.  EXAM: CT HEAD WITHOUT CONTRAST  CT CERVICAL SPINE WITHOUT CONTRAST  TECHNIQUE: Multidetector CT imaging of the head and cervical spine was performed following the standard protocol without intravenous contrast. Multiplanar CT image reconstructions of the cervical spine were also generated.  COMPARISON: CT 02/26/2016  FINDINGS: CT HEAD FINDINGS  Moderate to advanced atrophy. Chronic microvascular ischemic changes. Negative for acute infarct. Negative for intracranial hemorrhage or mass lesion.  Left frontal scalp hematoma. Negative for skull fracture.  CT CERVICAL SPINE FINDINGS  3 mm anterior listhesis C4-5 is unchanged. 3 mm anterior listhesis C7-T1 also unchanged. Slip is due degenerative due to facet arthritis. Disc degeneration and spondylosis at C5-6. Mild anterior slip C6-7 unchanged. Diffuse facet degeneration on the left. Mild dextroscoliosis.  Negative for cervical spine fracture.  IMPRESSION: Atrophy and chronic microvascular ischemia. No acute intracranial abnormality  Left frontal scalp hematoma  Cervical degenerative changes. Negative for fracture.   Electronically  Signed By: Marlan Palauharles Clark M.D. On: 03/03/2016 16:47          I personally reviewed the radiologic studies    ED Course:  Patient's stay here was uneventful and does not appear to have suffered any significant injury from today's fall. Laboratory work was performed since this with the second non-syncopal fall in the last week. Her laboratory work appears normal and her EKG does not show any ischemic changes. Patient's hemodynamically stable and I felt could be discharged back to the nursing facility. BLS transport will be arranged. Patient otherwise has no complaints and should continue with her normal medication.    Assessment: * Non-syncopal fall status post acute closed head injury    Plan: * Outpatient management Continue current medications Patient was advised to return immediately if condition worsens. Patient was advised to follow up with their primary care physician or other specialized physicians involved in their outpatient care. The patient and/or family member/power of attorney had laboratory results reviewed at the bedside. All questions and concerns were addressed and appropriate discharge instructions were distributed by the nursing staff.             Jennye MoccasinBrian S Keziyah Kneale, MD 03/03/16 [phone]

## 2016-03-03 NOTE — ED Notes (Signed)
[facility] and was unable to to get through to staff to give report.

## 2016-03-03 NOTE — ED Notes (Signed)
Patient arrives [facility] to [facility] ED with complaint of unwitnessed fall. Patient was found by staff with brusing to the  Left forehead. Old bruising noted to the left face as well from prior fall last month.c/o head pain without n/v/dizziness

## 2016-04-25 ENCOUNTER — Encounter: Payer: Self-pay | Admitting: *Deleted

## 2016-04-25 ENCOUNTER — Inpatient Hospital Stay
Admission: EM | Admit: 2016-04-25 | Discharge: 2016-04-30 | DRG: 871 | Disposition: A | Discharge status: Skilled Nursing Facility | Payer: Medicare Other | Attending: Internal Medicine | Admitting: Internal Medicine

## 2016-04-25 ENCOUNTER — Emergency Department: Payer: Medicare Other

## 2016-04-25 DIAGNOSIS — J189 Pneumonia, unspecified organism: Secondary | ICD-10-CM | POA: Diagnosis present

## 2016-04-25 DIAGNOSIS — I10 Essential (primary) hypertension: Secondary | ICD-10-CM | POA: Diagnosis present

## 2016-04-25 DIAGNOSIS — Z1612 Extended spectrum beta lactamase (ESBL) resistance: Secondary | ICD-10-CM | POA: Diagnosis present

## 2016-04-25 DIAGNOSIS — G9341 Metabolic encephalopathy: Secondary | ICD-10-CM | POA: Diagnosis present

## 2016-04-25 DIAGNOSIS — Z66 Do not resuscitate: Secondary | ICD-10-CM | POA: Diagnosis present

## 2016-04-25 DIAGNOSIS — Z7982 Long term (current) use of aspirin: Secondary | ICD-10-CM | POA: Diagnosis not present

## 2016-04-25 DIAGNOSIS — A419 Sepsis, unspecified organism: Secondary | ICD-10-CM

## 2016-04-25 DIAGNOSIS — Z79899 Other long term (current) drug therapy: Secondary | ICD-10-CM | POA: Diagnosis not present

## 2016-04-25 DIAGNOSIS — Z993 Dependence on wheelchair: Secondary | ICD-10-CM | POA: Diagnosis not present

## 2016-04-25 DIAGNOSIS — E039 Hypothyroidism, unspecified: Secondary | ICD-10-CM | POA: Diagnosis present

## 2016-04-25 DIAGNOSIS — N390 Urinary tract infection, site not specified: Secondary | ICD-10-CM | POA: Diagnosis present

## 2016-04-25 DIAGNOSIS — B9620 Unspecified Escherichia coli [E. coli] as the cause of diseases classified elsewhere: Secondary | ICD-10-CM | POA: Diagnosis present

## 2016-04-25 DIAGNOSIS — F0390 Unspecified dementia without behavioral disturbance: Secondary | ICD-10-CM | POA: Diagnosis present

## 2016-04-25 DIAGNOSIS — Z823 Family history of stroke: Secondary | ICD-10-CM

## 2016-04-25 DIAGNOSIS — E876 Hypokalemia: Secondary | ICD-10-CM | POA: Diagnosis present

## 2016-04-25 DIAGNOSIS — G3184 Mild cognitive impairment, so stated: Secondary | ICD-10-CM | POA: Diagnosis present

## 2016-04-25 LAB — URINALYSIS COMPLETE WITH MICROSCOPIC (ARMC ONLY)
BILIRUBIN URINE: NEGATIVE
Glucose, UA: NEGATIVE mg/dL
KETONES UR: NEGATIVE mg/dL
NITRITE: POSITIVE — AB
PH: 5 (ref 5.0–8.0)
PROTEIN: 30 mg/dL — AB
Specific Gravity, Urine: 1.015 (ref 1.005–1.030)

## 2016-04-25 LAB — COMPREHENSIVE METABOLIC PANEL
ALT: 18 U/L (ref 14–54)
AST: 33 U/L (ref 15–41)
Albumin: 3.7 g/dL (ref 3.5–5.0)
Alkaline Phosphatase: 89 U/L (ref 38–126)
Anion gap: 9 (ref 5–15)
BUN: 19 mg/dL (ref 6–20)
CHLORIDE: 107 mmol/L (ref 101–111)
CO2: 23 mmol/L (ref 22–32)
CREATININE: 0.93 mg/dL (ref 0.44–1.00)
Calcium: 9 mg/dL (ref 8.9–10.3)
GFR, EST NON AFRICAN AMERICAN: 53 mL/min — AB (ref >60)
Glucose, Bld: 137 mg/dL — ABNORMAL HIGH (ref 65–99)
POTASSIUM: 4.4 mmol/L (ref 3.5–5.1)
SODIUM: 139 mmol/L (ref 135–145)
Total Bilirubin: 0.1 mg/dL — ABNORMAL LOW (ref 0.3–1.2)
Total Protein: 7.4 g/dL (ref 6.5–8.1)

## 2016-04-25 LAB — CBC WITH DIFFERENTIAL/PLATELET
Basophils Absolute: 0 10*3/uL (ref 0–0.1)
Basophils Relative: 0 %
Eosinophils Absolute: 0 10*3/uL (ref 0–0.7)
Eosinophils Relative: 0 %
HEMATOCRIT: 34.5 % — AB (ref 35.0–47.0)
HEMOGLOBIN: 11.7 g/dL — AB (ref 12.0–16.0)
LYMPHS ABS: 0.8 10*3/uL — AB (ref 1.0–3.6)
LYMPHS PCT: 6 %
MCH: 33 pg (ref 26.0–34.0)
MCHC: 34 g/dL (ref 32.0–36.0)
MCV: 97 fL (ref 80.0–100.0)
MONOS PCT: 5 %
Monocytes Absolute: 0.7 10*3/uL (ref 0.2–0.9)
NEUTROS ABS: 12.8 10*3/uL — AB (ref 1.4–6.5)
NEUTROS PCT: 89 %
Platelets: 215 10*3/uL (ref 150–440)
RBC: 3.56 MIL/uL — AB (ref 3.80–5.20)
RDW: 13.9 % (ref 11.5–14.5)
WBC: 14.3 10*3/uL — AB (ref 3.6–11.0)

## 2016-04-25 LAB — LACTIC ACID, PLASMA: LACTIC ACID, VENOUS: 1.4 mmol/L (ref 0.5–1.9)

## 2016-04-25 LAB — MRSA PCR SCREENING: MRSA by PCR: NEGATIVE

## 2016-04-25 LAB — TROPONIN I

## 2016-04-25 MED ORDER — VANCOMYCIN HCL IN DEXTROSE 1-5 GM/200ML-% IV SOLN
1000.0000 mg | Freq: Once | INTRAVENOUS | Status: AC
  Administered 2016-04-25: 1000 mg via INTRAVENOUS
  Filled 2016-04-25: qty 200

## 2016-04-25 MED ORDER — PIPERACILLIN-TAZOBACTAM 3.375 G IVPB 30 MIN
3.3750 g | Freq: Once | INTRAVENOUS | Status: AC
  Administered 2016-04-25: 3.375 g via INTRAVENOUS
  Filled 2016-04-25: qty 50

## 2016-04-25 MED ORDER — HEPARIN SODIUM (PORCINE) 5000 UNIT/ML IJ SOLN
5000.0000 [IU] | Freq: Three times a day (TID) | INTRAMUSCULAR | Status: DC
  Administered 2016-04-25: 5000 [IU] via SUBCUTANEOUS
  Filled 2016-04-25: qty 1

## 2016-04-25 MED ORDER — SODIUM CHLORIDE 0.9 % IV BOLUS (SEPSIS)
1000.0000 mL | Freq: Once | INTRAVENOUS | Status: AC
  Administered 2016-04-25: 1000 mL via INTRAVENOUS

## 2016-04-25 MED ORDER — LEVOTHYROXINE SODIUM 75 MCG PO TABS
75.0000 ug | ORAL_TABLET | Freq: Every day | ORAL | Status: DC
  Administered 2016-04-26 – 2016-04-30 (×5): 75 ug via ORAL
  Filled 2016-04-25 (×5): qty 1

## 2016-04-25 MED ORDER — ESCITALOPRAM OXALATE 10 MG PO TABS
20.0000 mg | ORAL_TABLET | Freq: Every day | ORAL | Status: DC
Start: 2016-04-25 — End: 2016-04-30
  Administered 2016-04-27 – 2016-04-30 (×4): 20 mg via ORAL
  Filled 2016-04-25 (×5): qty 2

## 2016-04-25 MED ORDER — PIPERACILLIN-TAZOBACTAM 3.375 G IVPB
3.3750 g | Freq: Three times a day (TID) | INTRAVENOUS | Status: DC
  Administered 2016-04-25 – 2016-04-27 (×6): 3.375 g via INTRAVENOUS
  Filled 2016-04-25 (×8): qty 50

## 2016-04-25 MED ORDER — VITAMIN B-12 1000 MCG PO TABS
1000.0000 ug | ORAL_TABLET | Freq: Every day | ORAL | Status: DC
  Administered 2016-04-27 – 2016-04-30 (×4): 1000 ug via ORAL
  Filled 2016-04-25 (×5): qty 1

## 2016-04-25 MED ORDER — SODIUM CHLORIDE 0.9 % IV BOLUS (SEPSIS)
500.0000 mL | Freq: Once | INTRAVENOUS | Status: AC
  Administered 2016-04-25: 500 mL via INTRAVENOUS

## 2016-04-25 MED ORDER — KETOROLAC TROMETHAMINE 30 MG/ML IJ SOLN
30.0000 mg | Freq: Once | INTRAMUSCULAR | Status: AC
  Administered 2016-04-25: 30 mg via INTRAVENOUS
  Filled 2016-04-25: qty 1

## 2016-04-25 MED ORDER — ENOXAPARIN SODIUM 40 MG/0.4ML ~~LOC~~ SOLN
40.0000 mg | SUBCUTANEOUS | Status: DC
  Administered 2016-04-25: 40 mg via SUBCUTANEOUS
  Filled 2016-04-25 (×2): qty 0.4

## 2016-04-25 MED ORDER — ACETAMINOPHEN 325 MG PO TABS: 650.0000 mg | ORAL_TABLET | Freq: Four times a day (QID) | ORAL | Status: DC | PRN

## 2016-04-25 MED ORDER — ACETAMINOPHEN 650 MG RE SUPP
650.0000 mg | RECTAL | Status: DC | PRN
  Administered 2016-04-25 (×2): 650 mg via RECTAL
  Filled 2016-04-25 (×2): qty 1

## 2016-04-25 MED ORDER — ASPIRIN 81 MG PO CHEW
81.0000 mg | CHEWABLE_TABLET | Freq: Every day | ORAL | Status: DC
  Administered 2016-04-27 – 2016-04-30 (×4): 81 mg via ORAL
  Filled 2016-04-25 (×5): qty 1

## 2016-04-25 MED ORDER — MEMANTINE HCL 10 MG PO TABS
10.0000 mg | ORAL_TABLET | Freq: Two times a day (BID) | ORAL | Status: DC
  Administered 2016-04-26 – 2016-04-30 (×9): 10 mg via ORAL
  Filled 2016-04-25 (×10): qty 1

## 2016-04-25 MED ORDER — SODIUM CHLORIDE 0.9 % IV SOLN
INTRAVENOUS | Status: AC
  Administered 2016-04-25 – 2016-04-26 (×2): via INTRAVENOUS

## 2016-04-25 MED ORDER — DILTIAZEM HCL ER COATED BEADS 240 MG PO CP24
360.0000 mg | ORAL_CAPSULE | Freq: Every day | ORAL | Status: DC
  Administered 2016-04-25 – 2016-04-30 (×6): 360 mg via ORAL
  Filled 2016-04-25 (×6): qty 1

## 2016-04-25 MED ORDER — ACETAMINOPHEN 650 MG RE SUPP
650.0000 mg | Freq: Once | RECTAL | Status: AC
  Administered 2016-04-25: 650 mg via RECTAL

## 2016-04-25 MED ORDER — ACETAMINOPHEN 650 MG RE SUPP
RECTAL | Status: AC
Start: 2016-04-25 — End: 2016-04-25
  Administered 2016-04-25: 650 mg via RECTAL
  Filled 2016-04-25: qty 1

## 2016-04-25 NOTE — ED Triage Notes (Addendum)
Pt arrives via EMS from [facility], staff states she was not her norma self this AM, not talking at the nurses station, pt arrives yelling and moaning, pt pale in color, dry mouth and lips, pt has hx of dementia

## 2016-04-25 NOTE — H&P (Signed)
[facility] [place] [facility]   PATIENT NAME: Alice Manning    MR#:  [id number]  DATE OF BIRTH:  04/23/1926  DATE OF ADMISSION:  04/25/2016  PRIMARY CARE PHYSICIAN: Randie HeinzAnne Marie Mukamana, NP   REQUESTING/REFERRING PHYSICIAN: Dr. Lenard Lancepaduchowski  CHIEF COMPLAINT:   Chief Complaint  Patient presents with  . Altered Mental Status    HISTORY OF PRESENT ILLNESS: Alice Manning  is a 82 y.o. female with a known history of Hypertension, dementia, [facility]. Found to have sepsis with UTI and Pneumonia in ER. She is DNR, vitals stable.  PAST MEDICAL HISTORY:   Past Medical History:  Diagnosis Date  . Hyperlipemia   . Hypertension   . Mild cognitive impairment     PAST SURGICAL HISTORY: History reviewed. No pertinent surgical history.  SOCIAL HISTORY:  Social History  Substance Use Topics  . Smoking status: Never Smoker  . Smokeless tobacco: Not on file  . Alcohol use No    FAMILY HISTORY:  Family History  Problem Relation Age of Onset  . Stroke Mother     DRUG ALLERGIES: No Known Allergies  REVIEW OF SYSTEMS:   Pt is drowsy, not able to give ROS.  MEDICATIONS AT HOME:  Prior to Admission medications   Medication Sig Start Date End Date Taking? Authorizing Provider  acetaminophen (TYLENOL) 500 MG tablet Take 1,000 mg by mouth 3 (three) times daily.   Yes Historical Provider, MD  aspirin 81 MG tablet Take 81 mg by mouth daily.   Yes Historical Provider, MD  Cranberry 450 MG CAPS Take 1 capsule by mouth daily.   Yes Historical Provider, MD  diltiazem (TIAZAC) 360 MG 24 hr capsule Take 360 mg by mouth daily.   Yes Historical Provider, MD  escitalopram (LEXAPRO) 20 MG tablet Take 20 mg by mouth daily.   Yes Historical Provider, MD  furosemide (LASIX) 40 MG tablet Take 20 mg by mouth daily.    Yes Historical Provider, MD  levothyroxine (SYNTHROID, LEVOTHROID) 75 MCG tablet Take 75 mcg by mouth daily before breakfast.   Yes  Historical Provider, MD  lisinopril (PRINIVIL,ZESTRIL) 40 MG tablet Take 40 mg by mouth daily.   Yes Historical Provider, MD  loperamide (IMODIUM) 2 MG capsule Take 2-4 mg by mouth as needed for diarrhea or loose stools. Take 4mg  after 1st loose stool then 2mg  after each subsequent loose stool (do not exceed 4 tablets in 24 hours) *if diarrhea persists longer than 24 hours call doctor*   Yes Historical Provider, MD  memantine (NAMENDA) 10 MG tablet Take 10 mg by mouth 2 (two) times daily.   Yes Historical Provider, MD  Menthol (ICY HOT) 5 % PTCH Apply 1 patch topically daily.   Yes Historical Provider, MD  vitamin B-12 (CYANOCOBALAMIN) 1000 MCG tablet Take 1,000 mcg by mouth daily.   Yes Historical Provider, MD      PHYSICAL EXAMINATION:   VITAL SIGNS: Blood pressure (!) 185/81, pulse (!) 102, temperature (!) 102.8 F (39.3 C), temperature source Rectal, resp. rate 20, height 5\' 1"  (1.549 m), weight 49.9 kg (110 lb), SpO2 99 %.  GENERAL:  82 y.o.-year-old patient lying in the bed with sick appearance.  EYES: Pupils equal, round, reactive to light . No scleral icterus. Extraocular muscles intact.  HEENT: Head atraumatic, normocephalic. Oropharynx and nasopharynx clear. Mucosa dry. NECK:  Supple, no jugular venous distention. No thyroid enlargement, no tenderness.  LUNGS: Normal breath sounds bilaterally, no wheezing,  some crepitation. No use of accessory muscles of respiration. Have nasal canula oxygen. CARDIOVASCULAR: S1, S2 normal. No murmurs, rubs, or gallops.  ABDOMEN: Soft, nontender, nondistended. Bowel sounds present. No organomegaly or mass.  EXTREMITIES: No pedal edema, cyanosis, or clubbing.  NEUROLOGIC: Pt is drowsy, opens eye to call and stimuli, but confused and very weak looking. PSYCHIATRIC: The patient is drowsy.  SKIN: No obvious rash, lesion, or ulcer.   LABORATORY PANEL:   CBC  Recent Labs Lab 04/25/16 0819  WBC 14.3*  HGB 11.7*  HCT 34.5*  PLT 215  MCV 97.0   MCH 33.0  MCHC 34.0  RDW 13.9  LYMPHSABS 0.8*  MONOABS 0.7  EOSABS 0.0  BASOSABS 0.0   ------------------------------------------------------------------------------------------------------------------  Chemistries   Recent Labs Lab 04/25/16 0819  NA 139  K 4.4  CL 107  CO2 23  GLUCOSE 137*  BUN 19  CREATININE 0.93  CALCIUM 9.0  AST 33  ALT 18  ALKPHOS 89  BILITOT 0.1*   ------------------------------------------------------------------------------------------------------------------ estimated creatinine clearance is 31.6 mL/min (by C-G formula based on SCr of 0.93 mg/dL). ------------------------------------------------------------------------------------------------------------------ No results for input(s): TSH, T4TOTAL, T3FREE, THYROIDAB in the last 72 hours.  Invalid input(s): FREET3   Coagulation profile No results for input(s): INR, PROTIME in the last 168 hours. ------------------------------------------------------------------------------------------------------------------- No results for input(s): DDIMER in the last 72 hours. -------------------------------------------------------------------------------------------------------------------  Cardiac Enzymes  Recent Labs Lab 04/25/16 0819  TROPONINI <0.03   ------------------------------------------------------------------------------------------------------------------ Invalid input(s): POCBNP  ---------------------------------------------------------------------------------------------------------------  Urinalysis    Component Value Date/Time   COLORURINE YELLOW (A) 04/25/2016 0819   APPEARANCEUR CLOUDY (A) 04/25/2016 0819   LABSPEC 1.015 04/25/2016 0819   PHURINE 5.0 04/25/2016 0819   GLUCOSEU NEGATIVE 04/25/2016 0819   HGBUR 1+ (A) 04/25/2016 0819   BILIRUBINUR NEGATIVE 04/25/2016 0819   KETONESUR NEGATIVE 04/25/2016 0819   PROTEINUR 30 (A) 04/25/2016 0819   NITRITE POSITIVE (A)  04/25/2016 0819   LEUKOCYTESUR 3+ (A) 04/25/2016 0819     RADIOLOGY: Dg Chest Port 1 View  Result Date: 04/25/2016 CLINICAL DATA:  Fever EXAM: PORTABLE CHEST 1 VIEW COMPARISON:  None. FINDINGS: There is a focal area of infiltrate in the right base. Lungs elsewhere are clear. Heart size and pulmonary vascularity are normal. No adenopathy. There is atherosclerotic calcification in the aorta. There is evidence of old fracture of the lateral left clavicle with remodeling. IMPRESSION: Focal area of infiltrate right base. Lungs elsewhere clear. There is aortic atherosclerosis. Followup PA and lateral chest radiographs recommended in 3-4 weeks following trial of antibiotic therapy to ensure resolution and exclude underlying malignancy. Electronically Signed   By: Bretta Bang III M.D.   On: 04/25/2016 08:54    EKG: Orders placed or performed during the hospital encounter of 04/25/16  . ED EKG 12-Lead  . ED EKG 12-Lead    IMPRESSION AND PLAN:  * Sepsis   UTI and Pneumonia    IV NS, Vanc+ Zosyn   Bl and Urine Cx sent   SLP eval to r/o aspiration   Pt is DNR, Discussed critical situation with family in room.  * Htn   COnt Cardizem,hold other meds.  * dementia   Cont baseline meds.  All the records are reviewed and case discussed with ED provider. Management plans discussed with the patient, family and they are in agreement.  CODE STATUS: DNR Code Status History    This patient does not have a recorded code status. Please follow your organizational policy for patients in this situation.  TOTAL TIME TAKING CARE OF THIS PATIENT: 50 critical care minutes.    Altamese Dilling M.D on 04/25/2016   Between 7am to 6pm - Pager - [phone]  After 6pm go to [email or web address] - Social research officer, government  [facility]  Office  [phone]  CC: Primary care physician; Randie Heinz, NP   Note: This dictation was prepared with Dragon dictation along with  smaller phrase technology. Any transcriptional errors that result from this process are unintentional.

## 2016-04-25 NOTE — ED Notes (Signed)
Admitting MD at bedside.

## 2016-04-25 NOTE — ED Notes (Signed)
Pt resting in bed, eyes closed, resp even and unlabored, family at bedside 

## 2016-04-25 NOTE — ED Provider Notes (Signed)
[facility] Emergency Department Provider Note  Time seen: 8:36 AM  I have reviewed the triage vital signs and the nursing notes.   HISTORY  Chief Complaint Altered Mental Status    HPI Alice Manning is a 82 y.o. female with a past medical history of hypertension, hyperlipidemia, dementia, who presents to the emergency department with altered mental status and fever. According to EMS report the patient has been more confused than normal, noted to have a fever 100.0 this morning at the nursing home. Upon arrival to the emergency department the patient has a temperature of 102.8. Patient denies any complaints, but the history is limited by dementia. Patient does occasionally moan in the bed for no apparent reason. Appears to have very dry mucous membranes.  Past Medical History:  Diagnosis Date  . Hyperlipemia   . Hypertension   . Mild cognitive impairment     There are no active problems to display for this patient.   History reviewed. No pertinent surgical history.  Prior to Admission medications   Medication Sig Start Date End Date Taking? Authorizing Provider  acetaminophen (TYLENOL) 500 MG tablet Take 1,000 mg by mouth 3 (three) times daily.    Historical Provider, MD  aspirin 81 MG tablet Take 81 mg by mouth daily.    Historical Provider, MD  Cranberry 450 MG CAPS Take 1 capsule by mouth daily.    Historical Provider, MD  diltiazem (TIAZAC) 360 MG 24 hr capsule Take 360 mg by mouth daily.    Historical Provider, MD  escitalopram (LEXAPRO) 20 MG tablet Take 20 mg by mouth daily.    Historical Provider, MD  furosemide (LASIX) 40 MG tablet Take 40 mg by mouth daily.     Historical Provider, MD  levothyroxine (SYNTHROID, LEVOTHROID) 75 MCG tablet Take 75 mcg by mouth daily before breakfast.    Historical Provider, MD  lisinopril (PRINIVIL,ZESTRIL) 40 MG tablet Take 40 mg by mouth daily.    Historical Provider, MD  memantine (NAMENDA) 10 MG tablet Take  10 mg by mouth 2 (two) times daily.    Historical Provider, MD  Menthol (ICY HOT) 5 % PTCH Apply 1 patch topically daily.    Historical Provider, MD  vitamin B-12 (CYANOCOBALAMIN) 1000 MCG tablet Take 1,000 mcg by mouth daily.    Historical Provider, MD    No Known Allergies  History reviewed. No pertinent family history.  Social History Social History  Substance Use Topics  . Smoking status: Never Smoker  . Smokeless tobacco: Not on file  . Alcohol use No    Review of Systems Review of systems is negative however greatly limited by dementia in the patient's current altered mental status. ____________________________________________   PHYSICAL EXAM:  VITAL SIGNS: ED Triage Vitals  Enc Vitals Group     BP 04/25/16 0816 (!) 172/89     Pulse Rate 04/25/16 0816 (!) 108     Resp 04/25/16 0816 (!) 24     Temp 04/25/16 0832 (!) 102.8 F (39.3 C)     Temp Source 04/25/16 0832 Rectal     SpO2 04/25/16 0816 95 %     Weight 04/25/16 0817 110 lb (49.9 kg)     Height 04/25/16 0817 5\' 1"  (1.549 m)     Head Circumference --      Peak Flow --      Pain Score --      Pain Loc --      Pain Edu? --  Excl. in GC? --     Constitutional: Alert. Lying in bed, in no distress. Eyes: Normal exam ENT   Head: Normocephalic and atraumatic.   Nose: No congestion/rhinnorhea.   Mouth/Throat: Extremely dry appearing mucous membranes. Cardiovascular: Regular rhythm, rate around 110 beats per minutes. Respiratory: Mild tachypnea. Breath sounds are clear Gastrointestinal: Soft and nontender. No distention.  No reaction to abdominal palpation. Musculoskeletal: Nontender with normal range of motion in all extremities. No lower extremity tenderness or edema. Neurologic:  Patient is able to speak and answer simple questions, with questionable accuracy. Appears to move all extremities. Skin:  Skin is warm, dry and intact.  Psychiatric: Does appear somewhat confused at times. No  distress.  ____________________________________________    EKG  EKG reviewed and interpreted by myself shows sinus tachycardia 104 bpm, narrow QRS, normal axis, slightly prolonged PR interval consistent with first-degree AV block, nonspecific ST changes without ST elevation.  ____________________________________________    RADIOLOGY  Chest x-ray shows right basilar infiltrate.  ____________________________________________   INITIAL IMPRESSION / ASSESSMENT AND PLAN / ED COURSE  Pertinent labs & imaging results that were available during my care of the patient were reviewed by me and considered in my medical decision making (see chart for details).  The patient presents the emergency department with altered mental status fever meeting code sepsis criteria. Sepsis protocols had been activated. Patient appears to have very dry mucous membranes, we'll begin IV hydration as well as IV antibiotics, while awaiting lab and imaging results.  Chest x-ray shows possible right lower lobe pneumonia. Urinalysis consistent with urinary tract infection. Patient is being covered with antibiotics will admit to the hospital for further treatment as the patient currently meet sepsis criteria.  CRITICAL CARE Performed by: Minna AntisPADUCHOWSKI, Nicholson Starace   Total critical care time: 30 minutes  Critical care time was exclusive of separately billable procedures and treating other patients.  Critical care was necessary to treat or prevent imminent or life-threatening deterioration.  Critical care was time spent personally by me on the following activities: development of treatment plan with patient and/or surrogate as well as nursing, discussions with consultants, evaluation of patient's response to treatment, examination of patient, obtaining history from patient or surrogate, ordering and performing treatments and interventions, ordering and review of laboratory studies, ordering and review of radiographic studies,  pulse oximetry and re-evaluation of patient's condition.   ____________________________________________   FINAL CLINICAL IMPRESSION(S) / ED DIAGNOSES  Sepsis Urinary tract infection Pneumonia   Minna AntisKevin Sunil Hue, MD 04/25/16 [phone]

## 2016-04-25 NOTE — ED Notes (Signed)
Called code sepsis to Ardelle Ballscarelink, Thomas, at [phone]

## 2016-04-25 NOTE — ED Notes (Signed)
Family at bedside, updated on plan of care.

## 2016-04-25 NOTE — ED Notes (Signed)
Pt placed on 2L [place] for comfort.

## 2016-04-25 NOTE — ED Notes (Signed)
Pt cleaned an sheets changed in bed

## 2016-04-25 NOTE — ED Notes (Signed)
EDP at bedside  

## 2016-04-26 LAB — BASIC METABOLIC PANEL
Anion gap: 7 (ref 5–15)
BUN: 16 mg/dL (ref 6–20)
CHLORIDE: 114 mmol/L — AB (ref 101–111)
CO2: 22 mmol/L (ref 22–32)
CREATININE: 0.97 mg/dL (ref 0.44–1.00)
Calcium: 8.1 mg/dL — ABNORMAL LOW (ref 8.9–10.3)
GFR calc Af Amer: 59 mL/min — ABNORMAL LOW (ref >60)
GFR calc non Af Amer: 51 mL/min — ABNORMAL LOW (ref >60)
Glucose, Bld: 93 mg/dL (ref 65–99)
Potassium: 3.4 mmol/L — ABNORMAL LOW (ref 3.5–5.1)
Sodium: 143 mmol/L (ref 135–145)

## 2016-04-26 LAB — CBC
HCT: 28.8 % — ABNORMAL LOW (ref 35.0–47.0)
Hemoglobin: 9.7 g/dL — ABNORMAL LOW (ref 12.0–16.0)
MCH: 32.6 pg (ref 26.0–34.0)
MCHC: 33.8 g/dL (ref 32.0–36.0)
MCV: 96.4 fL (ref 80.0–100.0)
PLATELETS: 168 10*3/uL (ref 150–440)
RBC: 2.99 MIL/uL — ABNORMAL LOW (ref 3.80–5.20)
RDW: 13.8 % (ref 11.5–14.5)
WBC: 14.9 10*3/uL — ABNORMAL HIGH (ref 3.6–11.0)

## 2016-04-26 MED ORDER — POTASSIUM CHLORIDE CRYS ER 20 MEQ PO TBCR
20.0000 meq | EXTENDED_RELEASE_TABLET | Freq: Two times a day (BID) | ORAL | Status: AC
  Administered 2016-04-26 (×2): 20 meq via ORAL
  Filled 2016-04-26 (×2): qty 1

## 2016-04-26 NOTE — NC FL2 (Signed)
[place] MEDICAID FL2 LEVEL OF CARE SCREENING TOOL     IDENTIFICATION  Patient Name: Alice Manning Birthdate: [birth year] Sex: female Admission Date (Current Location): 04/25/2016  [place] and [place] Number:  Chiropodist and Address:  [facility], [address], [place], [place] [address]      Provider Number: [id number]  Attending Physician Name and Address:  Altamese Dilling, MD  Relative Name and Phone Number:       Current Level of Care: Hospital Recommended Level of Care: Assisted Living Facility, Memory Care Prior Approval Number:    Date Approved/Denied:   PASRR Number:    Discharge Plan: Domiciliary (Rest home)    Current Diagnoses: Primary: Dementia  Patient Active Problem List   Diagnosis Date Noted  . Sepsis (HCC) 04/25/2016  . Pneumonia 04/25/2016    Orientation RESPIRATION BLADDER Height & Weight     Self  O2 (2.5 Liters Oxygen ) Incontinent Weight: 110 lb (49.9 kg) Height:  5\' 1"  (154.9 cm)  BEHAVIORAL SYMPTOMS/MOOD NEUROLOGICAL BOWEL NUTRITION STATUS   (none )  (none) Continent Diet (Thin liquid;Dysphagia 1 (Puree) )  AMBULATORY STATUS COMMUNICATION OF NEEDS Skin   Extensive Assist (Primarily uses a wheel chair. ) Verbally Normal                       Personal Care Assistance Level of Assistance  Bathing, Feeding, Dressing Bathing Assistance: Limited assistance Feeding assistance: Limited assistance Dressing Assistance: Limited assistance     Functional Limitations Info  Sight, Hearing, Speech Sight Info: Adequate Hearing Info: Adequate Speech Info: Adequate    SPECIAL CARE FACTORS FREQUENCY                       Contractures      Additional Factors Info  Code Status, Allergies, Isolation Precautions Code Status Info:  (DNR ) Allergies Info:  (No Known Allergies. )     Isolation Precautions Info:  (ESBL )     Current Medications (04/26/2016):  This is the current  hospital active medication list Current Facility-Administered Medications  Medication Dose Route Frequency Provider Last Rate Last Dose  . acetaminophen (TYLENOL) suppository 650 mg  650 mg Rectal Q4H PRN Altamese Dilling, MD   650 mg at 04/25/16 2148  . acetaminophen (TYLENOL) tablet 650 mg  650 mg Oral Q6H PRN Altamese Dilling, MD      . aspirin chewable tablet 81 mg  81 mg Oral Daily Altamese Dilling, MD      . diltiazem (CARDIZEM CD) 24 hr capsule 360 mg  360 mg Oral Daily Altamese Dilling, MD   360 mg at 04/26/16 1120  . enoxaparin (LOVENOX) injection 40 mg  40 mg Subcutaneous Q24H Altamese Dilling, MD   40 mg at 04/25/16 2148  . escitalopram (LEXAPRO) tablet 20 mg  20 mg Oral Daily Altamese Dilling, MD      . levothyroxine (SYNTHROID, LEVOTHROID) tablet 75 mcg  75 mcg Oral QAC breakfast Altamese Dilling, MD   75 mcg at 04/26/16 0820  . memantine (NAMENDA) tablet 10 mg  10 mg Oral BID Altamese Dilling, MD   10 mg at 04/26/16 1120  . piperacillin-tazobactam (ZOSYN) IVPB 3.375 g  3.375 g Intravenous Q8H Altamese Dilling, MD   3.375 g at 04/26/16 1450  . potassium chloride SA (K-DUR,KLOR-CON) CR tablet 20 mEq  20 mEq Oral BID Altamese Dilling, MD   20 mEq at 04/26/16 0828  . vitamin B-12 (  CYANOCOBALAMIN) tablet 1,000 mcg  1,000 mcg Oral Daily Altamese DillingVaibhavkumar Vachhani, MD         Discharge Medications: Please see discharge summary for a list of discharge medications.  Relevant Imaging Results:  Relevant Lab Results:   Additional Information  (SSN: [id number])  Lakin Romer, Darleen CrockerBailey M, LCSW

## 2016-04-26 NOTE — Clinical Social Work Note (Signed)
Clinical Social Work Assessment  Patient Details  Name: Alice Manning MRN: [id number] Date of Birth: 12/28/1925  Date of referral:  04/26/16               Reason for consult:  Other (Comment Required) ([facility])                Permission sought to share information with:  Facility Industrial/product designerContact Representative Permission granted to share information::  Yes, Verbal Permission Granted  Name::      [facility]   Agency::     Relationship::     Contact Information:     Housing/Transportation Living arrangements for the past 2 months:  Assisted Living Facility Source of Information:  Power of Attorney, Adult Children, Facility Patient Interpreter Needed:  None Criminal Activity/Legal Involvement Pertinent to Current Situation/Hospitalization:  No - Comment as needed Significant Relationships:  Adult Children Lives with:  Facility Resident Do you feel safe going back to the place where you live?    Need for family participation in patient care:  Yes (Comment)  Care giving concerns:  Patient [facility] (fax: [phone]).    Social Worker assessment / plan:  Visual merchandiserClinical Social Worker (CSW) received consult that [facility] is [facility]. CS[facility] and spoke with Billy CoastBrittany Roundtree Resident Care Coordinator at [facility]. Per [place] patient is a private pay resident and [facility]. Per [place] patient has only been in the memory care unit for 6 months. [place]any reported that patient is not on oxygen at baseline and primarily uses a wheelchair and can peddle the wheelchair with her. Per [place]any patient can sit to stand well however she falls often so she only uses the wheelchair to get around. Per [place] patient is on a no added salt diet at [facility]. CSW made [place]ittany aware that [facility] SLP is recommending a Thin liquid; Dysphagia 1  (Puree) diet. [place]ittany reported that patient was receiving home health PT and OT however it was not benefiting her so it was stopped. [place]Brittany reported that [facility] is their [facility]. [place]ittany reported that patient's son Princess BruinsRay Kalmar is patient's HPOA and that patient usually has EMS transport [facility]. CSW also contacted patient's son Rosalia HammersRay because per chart patient is only oriented to self. Per son he is patient's HPOA and the only adult child of hers that is involved with patient. Son prefers for patient to [facility] and stated that he does not mind paying the bill for it.   FL2 complete. CSW will continue to follow and assist as needed.   Employment status:  Retired Health and safety inspectornsurance information:  Medicare PT Recommendations:  Not assessed at this time Information / Referral to community resources:  Other (Comment Required) ([facility]  )  Patient/Family's Response to care:  Patient's son is agreeable for patient to return [facility].   Patient/Family's Understanding of and Emotional Response to Diagnosis, Current Treatment, and Prognosis:  Patient's son was pleasant and thanked CSW for calling.   Emotional Assessment Appearance:    Attitude/Demeanor/Rapport:  Unable to Assess Affect (typically observed):  Unable to Assess Orientation:  Oriented to Self, Fluctuating Orientation (Suspected and/or reported Sundowners) Alcohol / Substance use:  Not Applicable Psych involvement (  Current and /or in the community):  No (Comment)  Discharge Needs  Concerns to be addressed:  Discharge Planning Concerns Readmission within the last 30 days:  No Current discharge risk:  Chronically ill, Cognitively Impaired, Dependent with Mobility Barriers to Discharge:  Continued Medical Work up   [employer], Darleen Crocker, LCSW 04/26/2016, 3:18 PM

## 2016-04-26 NOTE — Evaluation (Signed)
Clinical/Bedside Swallow Evaluation Patient Details  Name: Alice Manning MRN: [id number] Date of Birth: 08/10/1926  Today's Date: 04/26/2016 Time: SLP Start Time (ACUTE ONLY): [id number] SLP Stop Time (ACUTE ONLY): 1030 SLP Time Calculation (min) (ACUTE ONLY): 52 min  Past Medical History:  Past Medical History:  Diagnosis Date  . Hyperlipemia   . Hypertension   . Mild cognitive impairment    Past Surgical History: History reviewed. No pertinent surgical history. HPI:  Alice Manning  is a 82 y.o. female with a known history of Hypertension, dementia, [facility]. Found to have sepsis with UTI and Pneumonia in ER. She is DNR, vitals stable.   Assessment / Plan / Recommendation Clinical Impression  82 year old nursing home resident admitted 04/25/2016 with sepsis (UTI and pneumonia) is presenting with mild oropharyngeal dysphagia per clinical assessment.  The patient demonstrates baseline wet coughing (pneumonia) and is reluctant to accept POs from SLP.  However, the patient demonstrates no clinical indicators of aspiration with thin liquid or pureed solid.  The patient is edentulous and refused to attempt chewable solid for assessment.  Recommend puree diet with thin liquid with strict aspiration precautions.    Aspiration Risk  Mild aspiration risk;Risk for inadequate nutrition/hydration    Diet Recommendation Thin liquid;Dysphagia 1 (Puree)   Liquid Administration via: Cup;Straw Medication Administration: Whole meds with puree Supervision: Staff to assist with self feeding Postural Changes: Seated upright at 90 degrees    Other  Recommendations Oral Care Recommendations: Oral care QID   Follow up Recommendations       Frequency and Duration min 3x week          Prognosis Prognosis for Safe Diet Advancement: Good Barriers to Reach Goals: Cognitive deficits;Behavior      Swallow Study   General Date of Onset: 04/25/16 HPI: Alice Manning   is a 82 y.o. female with a known history of Hypertension, dementia[facility] Found to have sepsis with UTI and Pneumonia in ER. She is DNR, vitals stable. Type of Study: Bedside Swallow Evaluation Previous Swallow Assessment: None known Diet Prior to this Study: Information not available Respiratory Status: Room air Behavior/Cognition: Alert;Uncooperative Oral Cavity Assessment: Within Functional Limits Oral Cavity - Dentition: Edentulous Self-Feeding Abilities: Total assist (Patient has mitts on) Patient Positioning: Upright in bed Baseline Vocal Quality:  (Wet cough)    Oral/Motor/Sensory Function Overall Oral Motor/Sensory Function: Within functional limits   Ice Chips Ice chips: Within functional limits Presentation: Spoon Other Comments: Reluctant to accept   Thin Liquid Thin Liquid: Within functional limits Presentation: Spoon;Straw Other Comments: No immediate cough or increased rate of cough    Nectar Thick     Honey Thick     Puree Puree: Within functional limits Presentation: Spoon Other Comments: Reluctant to accept- took only 4 teaspoon presentation   Solid   GO   Solid:  (Small sample size) Other Comments: Pt refused graham cracker (spit out) and took only sliver of graham cracker with ice cream- no overt problems       Dollene PrimroseSusan G Pyper Olexa, MS/CCC- SLP  Tedd SiasAbernathy, Susie 04/26/2016,10:41 AM

## 2016-04-26 NOTE — Progress Notes (Signed)
[employer] [facility] - [place] at [facility]   PATIENT NAME: Alice Manning    MR#:  [id number]  DATE OF BIRTH:  09-Oct-1925  SUBJECTIVE:  CHIEF COMPLAINT:   Chief Complaint  Patient presents with  . Altered Mental Status     Was very lethargic on admission, septic with UTI and pneumonia.   More alert and talking, but have dementia today.  REVIEW OF SYSTEMS:   Not able to give due to dementia.  ROS  DRUG ALLERGIES:  No Known Allergies  VITALS:  Blood pressure 126/62, pulse 72, temperature 97.5 F (36.4 C), temperature source Oral, resp. rate 19, height 5\' 1"  (1.549 m), weight 49.9 kg (110 lb), SpO2 96 %.  PHYSICAL EXAMINATION:  GENERAL:  82 y.o.-year-old patient lying in the bed with no acute distress.  EYES: Pupils equal, round, reactive to light and accommodation. No scleral icterus. Extraocular muscles intact.  HEENT: Head atraumatic, normocephalic. Oropharynx and nasopharynx clear.  NECK:  Supple, no jugular venous distention. No thyroid enlargement, no tenderness.  LUNGS: Normal breath sounds bilaterally, no wheezing, rales,rhonchi or crepitation. No use of accessory muscles of respiration.  CARDIOVASCULAR: S1, S2 normal. No murmurs, rubs, or gallops.  ABDOMEN: Soft, nontender, nondistended. Bowel sounds present. No organomegaly or mass.  EXTREMITIES: No pedal edema, cyanosis, or clubbing.  NEUROLOGIC: Cranial nerves II through XII are intact. Muscle strength 3-4 /5 in all extremities. Sensation intact. Gait not checked.  PSYCHIATRIC: The patient is alert and not well oriented.  SKIN: No obvious rash, lesion, or ulcer.   Physical Exam LABORATORY PANEL:   CBC  Recent Labs Lab 04/26/16 0455  WBC 14.9*  HGB 9.7*  HCT 28.8*  PLT 168   ------------------------------------------------------------------------------------------------------------------  Chemistries   Recent Labs Lab 04/25/16 0819 04/26/16 0455  NA 139 143  K 4.4 3.4*  CL 107 114*   CO2 23 22  GLUCOSE 137* 93  BUN 19 16  CREATININE 0.93 0.97  CALCIUM 9.0 8.1*  AST 33  --   ALT 18  --   ALKPHOS 89  --   BILITOT 0.1*  --    ------------------------------------------------------------------------------------------------------------------  Cardiac Enzymes  Recent Labs Lab 04/25/16 0819  TROPONINI <0.03   ------------------------------------------------------------------------------------------------------------------  RADIOLOGY:  Dg Chest Port 1 View  Result Date: 04/25/2016 CLINICAL DATA:  Fever EXAM: PORTABLE CHEST 1 VIEW COMPARISON:  None. FINDINGS: There is a focal area of infiltrate in the right base. Lungs elsewhere are clear. Heart size and pulmonary vascularity are normal. No adenopathy. There is atherosclerotic calcification in the aorta. There is evidence of old fracture of the lateral left clavicle with remodeling. IMPRESSION: Focal area of infiltrate right base. Lungs elsewhere clear. There is aortic atherosclerosis. Followup PA and lateral chest radiographs recommended in 3-4 weeks following trial of antibiotic therapy to ensure resolution and exclude underlying malignancy. Electronically Signed   By: Bretta Bang III M.D.   On: 04/25/2016 08:54    ASSESSMENT AND PLAN:   Principal Problem:   Pneumonia Active Problems:   Sepsis (HCC)  * Sepsis   UTI and Pneumonia    IV NS, Vanc+ Zosyn- now MRSA PCR negative- so d/ced vanc.   Bl and Urine Cx sent- Ur growing Gram neg rods, bl cx negative.   SLP eval to r/o aspiration   Pt is DNR, Discussed critical situation with family in room.  * Htn   COnt Cardizem,hold other meds.  * dementia   Cont baseline meds.  * Hypothyroidism   Cont synthroid.  All the records are reviewed and case discussed with Care Management/Social Workerr. Management plans discussed with the patient, family and they are in agreement.  CODE STATUS: DNR  TOTAL TIME TAKING CARE OF THIS PATIENT: 35 minutes.     POSSIBLE D/C IN 1-2 DAYS, DEPENDING ON CLINICAL CONDITION.   Altamese DillingVACHHANI, Alice Manning M.D on 04/26/2016   Between 7am to 6pm - Pager - [phone]  After 6pm go to [email or web address] - Social research officer, governmentpassword EPAS [facility]  [facility] [place] [facility]  Office  [phone]  CC: Primary care physician; Randie HeinzAnne Marie Mukamana, NP  Note: This dictation was prepared with Dragon dictation along with smaller phrase technology. Any transcriptional errors that result from this process are unintentional.

## 2016-04-27 LAB — URINE CULTURE: Culture: >=100000 — AB

## 2016-04-27 MED ORDER — ENSURE ENLIVE PO LIQD
237.0000 mL | Freq: Two times a day (BID) | ORAL | Status: DC
  Administered 2016-04-27 – 2016-04-30 (×8): 237 mL via ORAL

## 2016-04-27 MED ORDER — SODIUM CHLORIDE 0.9 % IV SOLN
500.0000 mg | Freq: Two times a day (BID) | INTRAVENOUS | Status: DC
  Administered 2016-04-27 – 2016-04-28 (×3): 500 mg via INTRAVENOUS
  Filled 2016-04-27 (×4): qty 500

## 2016-04-27 MED ORDER — POTASSIUM CHLORIDE CRYS ER 20 MEQ PO TBCR
40.0000 meq | EXTENDED_RELEASE_TABLET | Freq: Once | ORAL | Status: AC
  Administered 2016-04-27: 40 meq via ORAL
  Filled 2016-04-27: qty 2

## 2016-04-27 MED ORDER — ENOXAPARIN SODIUM 30 MG/0.3ML ~~LOC~~ SOLN: 30.0000 mg | SUBCUTANEOUS | Status: DC

## 2016-04-27 NOTE — Progress Notes (Signed)
Anticoagulation monitoring(Lovenox):  82yo  F ordered Lovenox 40 mg Q24h  Filed Weights   04/25/16 0817 04/27/16 0422  Weight: 110 lb (49.9 kg) 102 lb 9.6 oz (46.5 kg)   BMI 20.8   Lab Results  Component Value Date   CREATININE 0.97 04/26/2016   CREATININE 0.93 04/25/2016   CREATININE 1.13 (H) 03/03/2016   Estimated Creatinine Clearance: 29.4 mL/min (by C-G formula based on SCr of 0.97 mg/dL). Hemoglobin & Hematocrit     Component Value Date/Time   HGB 9.7 (L) 04/26/2016 0455   HCT 28.8 (L) 04/26/2016 0455     Per Protocol for Patient with estCrcl < 30 ml/min and BMI < 40, will transition to Lovenox 30 mg Q24h      Bari MantisKristin Sallye Lunz PharmD Clinical Pharmacist 04/27/2016

## 2016-04-27 NOTE — Plan of Care (Signed)
Problem: Education: Goal: Knowledge of [facility] Education information/materials will improve Outcome: Not Progressing Pt confused  Problem: Health Behavior/Discharge Planning: Goal: Ability to manage health-related needs will improve Outcome: Not Progressing Pt confused     

## 2016-04-27 NOTE — Progress Notes (Signed)
Chart reviewed. Pt has been lethargic this am and hasn't taken PO's. Discussed with Nsg. IF Pt awakes enough for any Po's any has any difficulty with liquids, may downgrade to nectar thick. Continue with current diet for now. Check toleration 1-2 days

## 2016-04-27 NOTE — Progress Notes (Signed)
Initial Nutrition Assessment  DOCUMENTATION CODES:   Not applicable  INTERVENTION:  -Monitor intake and cater to pt preferences -Recommend Ensure Enlive po BID, each supplement provides 350 kcal and 20 grams of protein   NUTRITION DIAGNOSIS:   Predicted suboptimal nutrient intake related to acute illness as evidenced by percent weight loss, per patient/family report.   GOAL:   Patient will meet greater than or equal to 90% of their needs   MONITOR:   PO intake, Supplement acceptance  REASON FOR ASSESSMENT:   Consult Poor PO  ASSESSMENT:    82 y.o. Female admitted with AMS, septic from UTI and pneumonia. Noted pt has dementia, HLD, HTN  Pt sleeping this am, was able to wake but never opened eyes.  Reports she does not want to eat because she does not feel good.  Agreeable to supplement.  Unable to determine intake and wt loss prior to admission   Unable to complete Nutrition-Focused physical exam at this time. Per pt request not feeling well.  Medications reviewed:  Labs reviewed:  Diet Order:  DIET - DYS 1 Room service appropriate? Yes with Assist; Fluid consistency: Thin  Skin:  Reviewed, no issues  Last BM:  PTA  Height:   Ht Readings from Last 1 Encounters:  04/25/16 5\' 1"  (1.549 m)    Weight: Per chart 8# wt loss in the last month (7% wt loss in 1 month which is significant)  Wt Readings from Last 1 Encounters:  04/27/16 102 lb 9.6 oz (46.5 kg)    Ideal Body Weight:     BMI:  Body mass index is 19.39 kg/m.  Estimated Nutritional Needs:   Kcal:  1380-1610 kcals/d  Protein:  55-69 g/d  Fluid:  1380-1610 ml/d  EDUCATION NEEDS:   No education needs identified at this time  Wayburn Shaler B. Freida BusmanAllen, RD, LDN [phone] (pager) Weekend/On-Call pager [phone])

## 2016-04-27 NOTE — Progress Notes (Signed)
[employer] [facility] - [place] at [facility]   PATIENT NAME: Alice Manning    MR#:  [id number]  DATE OF BIRTH:  09/18/1925  SUBJECTIVE:  CHIEF COMPLAINT:   Chief Complaint  Patient presents with  . Altered Mental Status   Demented, no complaint. Poor oral intake.  REVIEW OF SYSTEMS:   Not able to give due to dementia.  ROS  DRUG ALLERGIES:  No Known Allergies  VITALS:  Blood pressure (!) 147/71, pulse 76, temperature 98.1 F (36.7 C), temperature source Oral, resp. rate 18, height 5\' 1"  (1.549 m), weight 102 lb 9.6 oz (46.5 kg), SpO2 97 %.  PHYSICAL EXAMINATION:  GENERAL:  82 y.o.-year-old patient lying in the bed with no acute distress.  EYES: Pupils equal, round, reactive to light and accommodation. No scleral icterus. Extraocular muscles intact.  HEENT: Head atraumatic, normocephalic. Oropharynx and nasopharynx clear.  NECK:  Supple, no jugular venous distention. No thyroid enlargement, no tenderness.  LUNGS: Normal breath sounds bilaterally, no wheezing, rales,rhonchi or crepitation. No use of accessory muscles of respiration.  CARDIOVASCULAR: S1, S2 normal. No murmurs, rubs, or gallops.  ABDOMEN: Soft, nontender, nondistended. Bowel sounds present. No organomegaly or mass.  EXTREMITIES: No pedal edema, cyanosis, or clubbing.  NEUROLOGIC: Cranial nerves II through XII are intact. Muscle strength 3-4 /5 in all extremities. Sensation intact. Gait not checked.  PSYCHIATRIC: The patient is awake but demented. SKIN: No obvious rash, lesion, or ulcer.   Physical Exam LABORATORY PANEL:   CBC  Recent Labs Lab 04/26/16 0455  WBC 14.9*  HGB 9.7*  HCT 28.8*  PLT 168   ------------------------------------------------------------------------------------------------------------------  Chemistries   Recent Labs Lab 04/25/16 0819 04/26/16 0455  NA 139 143  K 4.4 3.4*  CL 107 114*  CO2 23 22  GLUCOSE 137* 93  BUN 19 16  CREATININE 0.93 0.97  CALCIUM  9.0 8.1*  AST 33  --   ALT 18  --   ALKPHOS 89  --   BILITOT 0.1*  --    ------------------------------------------------------------------------------------------------------------------  Cardiac Enzymes  Recent Labs Lab 04/25/16 0819  TROPONINI <0.03   ------------------------------------------------------------------------------------------------------------------  RADIOLOGY:  No results found.  ASSESSMENT AND PLAN:   Principal Problem:   Pneumonia Active Problems:   Sepsis (HCC)  * Sepsis   UTI (ESBL) and Pneumonia, still leukocytosis.   She was on Vanc+ Zosyn- now MRSA PCR negative- so d/ced vanc.  Urine Cx: ESBL, bl cx negative. Follow-up CBC. Change to imipenem, discontinue Zosyn, follow-up ID consult.  Hypokalemia. Give potassium supplement.  * HTN   Cont Cardizem,hold other meds.  * dementia   Cont baseline meds.  * Hypothyroidism   Cont synthroid.  All the records are reviewed and case discussed with Care Management/Social Workerr. Management plans discussed with the patient's son and they are in agreement.  CODE STATUS: DNR  TOTAL TIME TAKING CARE OF THIS PATIENT: 37 minutes.    POSSIBLE D/C IN 2 DAYS, DEPENDING ON CLINICAL CONDITION.   Alice Manning, Alice Manning M.D on 04/27/2016   Between 7am to 6pm - Pager - [phone]  After 6pm go to [email or web address] - Social research officer, governmentpassword EPAS [facility]  [facility] [place] [facility]  Office  [phone]  CC: Primary care physician; Randie HeinzAnne Marie Mukamana, NP  Note: This dictation was prepared with Dragon dictation along with smaller phrase technology. Any transcriptional errors that result from this process are unintentional.

## 2016-04-27 NOTE — Progress Notes (Signed)
Spoke to CNA who reported Pt was able to awaken enough for some Po's this am. Tolerated bites of food and liquids with no s/s of aspiration. Continue with current diet

## 2016-04-27 NOTE — Progress Notes (Signed)
Pharmacy Antibiotic Note  Alice Manning is a 82 y.o. female admitted on 04/25/2016 with UTI ESBL infection.  Pharmacy has been consulted for Primaxin  dosing.  Plan: Will order a renal adjusted dose of imipenem-cilastatin 500 mg IV 12h.   Height: 5\' 1"  (154.9 cm) Weight: 102 lb 9.6 oz (46.5 kg) IBW/kg (Calculated) : 47.8  Temp (24hrs), Avg:98.6 F (37 C), Min:97.5 F (36.4 C), Max:99.5 F (37.5 C)   Recent Labs Lab 04/25/16 0819 04/26/16 0455  WBC 14.3* 14.9*  CREATININE 0.93 0.97  LATICACIDVEN 1.4  --     Estimated Creatinine Clearance: 29.4 mL/min (by C-G formula based on SCr of 0.97 mg/dL).    No Known Allergies  Antimicrobials this admission:   Dose adjustments this admission:   Microbiology results:   Thank you for allowing pharmacy to be a part of this patient's care.  Marty HeckWang, Marwan Lipe L 04/27/2016 12:43 PM

## 2016-04-28 LAB — BASIC METABOLIC PANEL
Anion gap: 9 (ref 5–15)
BUN: 15 mg/dL (ref 6–20)
CO2: 20 mmol/L — ABNORMAL LOW (ref 22–32)
CREATININE: 0.84 mg/dL (ref 0.44–1.00)
Calcium: 8.9 mg/dL (ref 8.9–10.3)
Chloride: 113 mmol/L — ABNORMAL HIGH (ref 101–111)
Glucose, Bld: 100 mg/dL — ABNORMAL HIGH (ref 65–99)
POTASSIUM: 3.9 mmol/L (ref 3.5–5.1)
SODIUM: 142 mmol/L (ref 135–145)

## 2016-04-28 LAB — CBC
HCT: 31.3 % — ABNORMAL LOW (ref 35.0–47.0)
Hemoglobin: 10.7 g/dL — ABNORMAL LOW (ref 12.0–16.0)
MCH: 32.9 pg (ref 26.0–34.0)
MCHC: 34.3 g/dL (ref 32.0–36.0)
MCV: 95.9 fL (ref 80.0–100.0)
PLATELETS: 213 10*3/uL (ref 150–440)
RBC: 3.26 MIL/uL — AB (ref 3.80–5.20)
RDW: 14.2 % (ref 11.5–14.5)
WBC: 6.8 10*3/uL (ref 3.6–11.0)

## 2016-04-28 MED ORDER — GUAIFENESIN-CODEINE 100-10 MG/5ML PO SOLN
10.0000 mL | Freq: Four times a day (QID) | ORAL | Status: DC | PRN
  Administered 2016-04-28 – 2016-04-29 (×2): 10 mL via ORAL
  Filled 2016-04-28 (×2): qty 10

## 2016-04-28 MED ORDER — SODIUM CHLORIDE 0.9 % IV SOLN
500.0000 mg | Freq: Three times a day (TID) | INTRAVENOUS | Status: DC
  Administered 2016-04-28 – 2016-04-29 (×3): 500 mg via INTRAVENOUS
  Filled 2016-04-28 (×4): qty 500

## 2016-04-28 MED ORDER — ENOXAPARIN SODIUM 40 MG/0.4ML ~~LOC~~ SOLN
40.0000 mg | SUBCUTANEOUS | Status: DC
  Administered 2016-04-28: 40 mg via SUBCUTANEOUS
  Filled 2016-04-28 (×2): qty 0.4

## 2016-04-28 MED ORDER — LISINOPRIL 20 MG PO TABS
20.0000 mg | ORAL_TABLET | Freq: Every day | ORAL | Status: DC
  Administered 2016-04-28 – 2016-04-30 (×3): 20 mg via ORAL
  Filled 2016-04-28 (×3): qty 1

## 2016-04-28 NOTE — Progress Notes (Signed)
Anticoagulation monitoring(Lovenox):  82yo  F ordered Lovenox 30 mg Q24h  Filed Weights   04/25/16 0817 04/27/16 0422  Weight: 110 lb (49.9 kg) 102 lb 9.6 oz (46.5 kg)   BMI 20.8   Lab Results  Component Value Date   CREATININE 0.84 04/28/2016   CREATININE 0.97 04/26/2016   CREATININE 0.93 04/25/2016   Estimated Creatinine Clearance: 34 mL/min (by C-G formula based on SCr of 0.84 mg/dL). Hemoglobin & Hematocrit     Component Value Date/Time   HGB 10.7 (L) 04/28/2016 0818   HCT 31.3 (L) 04/28/2016 0818   *renal fxn improved  Per Protocol for Patient with estCrcl > 30 ml/min and BMI < 40, will transition to Lovenox 40 mg Q24h      Bari MantisKristin Peytyn Trine PharmD Clinical Pharmacist 04/28/2016

## 2016-04-28 NOTE — Plan of Care (Signed)
Problem: Education: Goal: Knowledge of [facility] Education information/materials will improve Outcome: Not Progressing Pt confused  Problem: Health Behavior/Discharge Planning: Goal: Ability to manage health-related needs will improve Outcome: Not Progressing Pt confused     

## 2016-04-28 NOTE — Progress Notes (Signed)
Pharmacy Antibiotic Note  Alice Manning is a 82 y.o. female admitted on 04/25/2016 with UTI ESBL infection/PNA.  Pharmacy has been consulted for Primaxin dosing.  Plan: Will order a renal adjusted dose of imipenem-cilastatin 500 mg IV 12h.   8/27: Crcl improved to 34 ml/min. Will adjust Primaxin to 500mg  IV q8h.     Height: 5\' 1"  (154.9 cm) Weight: 102 lb 9.6 oz (46.5 kg) IBW/kg (Calculated) : 47.8  Temp (24hrs), Avg:98.9 F (37.2 C), Min:98.6 F (37 C), Max:99.2 F (37.3 C)   Recent Labs Lab 04/25/16 0819 04/26/16 0455 04/28/16 0300 04/28/16 0818  WBC 14.3* 14.9*  --  6.8  CREATININE 0.93 0.97 0.84  --   LATICACIDVEN 1.4  --   --   --     Estimated Creatinine Clearance: 34 mL/min (by C-G formula based on SCr of 0.84 mg/dL).    No Known Allergies  Antimicrobials this admission: Zosyn 8/24 >> 8/26 Vanc x 1 8/24 Primaxin 8/26 >>  Dose adjustments this admission: 8/27  Crcl  34 :  Primaxin Dose changed from 500mg  q12h to 500mg  q8h.  Microbiology results: Bcx neg. Ucx: E.coli ESBL MRSA PCE neg. CXR: infiltrate R base  Thank you for allowing pharmacy to be a part of this patient's care.  Zeno Hickel A 04/28/2016 12:52 PM

## 2016-04-28 NOTE — Plan of Care (Signed)
Problem: Pain Managment: Goal: General experience of comfort will improve Outcome: Progressing Pt does not complain of any pain.

## 2016-04-28 NOTE — Progress Notes (Signed)
[employer] - [place] at [facility]   PATIENT NAME: Alice SpannerGeraldine Manning    MR#:  [id number]  DATE OF BIRTH:  01/04/1926  SUBJECTIVE:  CHIEF COMPLAINT:   Chief Complaint  Patient presents with  . Altered Mental Status   Demented, no complaint. better oral intake.  REVIEW OF SYSTEMS:   Not able to give due to dementia.  ROS  DRUG ALLERGIES:  No Known Allergies  VITALS:  Blood pressure (!) 158/62, pulse 86, temperature 99.2 F (37.3 C), temperature source Oral, resp. rate 16, height 5\' 1"  (1.549 m), weight 102 lb 9.6 oz (46.5 kg), SpO2 96 %.  PHYSICAL EXAMINATION:  GENERAL:  82 y.o.-year-old patient lying in the bed with no acute distress.  EYES: Pupils equal, round, reactive to light and accommodation. No scleral icterus. Extraocular muscles intact.  HEENT: Head atraumatic, normocephalic. Oropharynx and nasopharynx clear.  NECK:  Supple, no jugular venous distention. No thyroid enlargement, no tenderness.  LUNGS: Normal breath sounds bilaterally, no wheezing, rales,rhonchi or crepitation. No use of accessory muscles of respiration.  CARDIOVASCULAR: S1, S2 normal. No murmurs, rubs, or gallops.  ABDOMEN: Soft, nontender, nondistended. Bowel sounds present. No organomegaly or mass.  EXTREMITIES: No pedal edema, cyanosis, or clubbing.  NEUROLOGIC: Cranial nerves II through XII are intact. Muscle strength 3-4 /5 in all extremities. Sensation intact. Gait not checked.  PSYCHIATRIC: The patient is awake but demented. SKIN: No obvious rash, lesion, or ulcer.   Physical Exam LABORATORY PANEL:   CBC  Recent Labs Lab 04/28/16 0818  WBC 6.8  HGB 10.7*  HCT 31.3*  PLT 213   ------------------------------------------------------------------------------------------------------------------  Chemistries   Recent Labs Lab 04/25/16 0819  04/28/16 0300  NA 139  < > 142  K 4.4  < > 3.9  CL 107  < > 113*  CO2 23  < > 20*  GLUCOSE 137*  < > 100*  BUN 19  < > 15   CREATININE 0.93  < > 0.84  CALCIUM 9.0  < > 8.9  AST 33  --   --   ALT 18  --   --   ALKPHOS 89  --   --   BILITOT 0.1*  --   --   < > = values in this interval not displayed. ------------------------------------------------------------------------------------------------------------------  Cardiac Enzymes  Recent Labs Lab 04/25/16 0819  TROPONINI <0.03   ------------------------------------------------------------------------------------------------------------------  RADIOLOGY:  No results found.  ASSESSMENT AND PLAN:   Principal Problem:   Pneumonia Active Problems:   Sepsis (HCC)  * Sepsis   UTI (ESBL) and Pneumonia,  Leukocytosis improved.   She was on Vanc+ Zosyn- now MRSA PCR negative- so d/ced vanc.  Urine Cx: ESBL, bl cx negative. Changed to imipenem, discontinue Zosyn, follow-up ID consult.  Hypokalemia. Give potassium supplement.  * HTN   Cont Cardizem, resume lisinipril.  * dementia   Cont baseline meds.  * Hypothyroidism   Cont synthroid.  All the records are reviewed and case discussed with Care Management/Social Workerr. Management plans discussed with the patient's son and they are in agreement.  CODE STATUS: DNR  TOTAL TIME TAKING CARE OF THIS PATIENT: 33 minutes.    POSSIBLE D/C IN 2 DAYS, DEPENDING ON CLINICAL CONDITION.   Shaune Pollackhen, Lania Zawistowski M.D on 04/28/2016   Between 7am to 6pm - Pager - [phone]  After 6pm go to [email or web address] - Social research officer, governmentpassword EPAS [facility]  [facility] [place] [facility]  Office  [phone]  CC: Primary care physician; Randie HeinzAnne Marie Mukamana, NP  Note: This  dictation was prepared with Dragon dictation along with smaller phrase technology. Any transcriptional errors that result from this process are unintentional.

## 2016-04-29 LAB — OCCULT BLOOD X 1 CARD TO LAB, STOOL: FECAL OCCULT BLD: NEGATIVE

## 2016-04-29 MED ORDER — LEVOFLOXACIN 500 MG PO TABS: 250.0000 mg | ORAL_TABLET | Freq: Every day | ORAL | Status: DC

## 2016-04-29 MED ORDER — SULFAMETHOXAZOLE-TRIMETHOPRIM 800-160 MG PO TABS
1.0000 | ORAL_TABLET | Freq: Two times a day (BID) | ORAL | Status: DC
  Administered 2016-04-29 – 2016-04-30 (×2): 1 via ORAL
  Filled 2016-04-29 (×2): qty 1

## 2016-04-29 MED ORDER — LEVOFLOXACIN 500 MG PO TABS
500.0000 mg | ORAL_TABLET | Freq: Once | ORAL | Status: AC
  Administered 2016-04-29: 500 mg via ORAL
  Filled 2016-04-29: qty 1

## 2016-04-29 NOTE — Care Management Important Message (Signed)
Important Message  Patient Details  Name: Alice Manning MRN: [id number] Date of Birth: 02/18/1926   Medicare Important Message Given:  Yes    Chapman FitchBOWEN, Ardra Kuznicki T, RN 04/29/2016, 5:07 PM

## 2016-04-29 NOTE — Consult Note (Signed)
[facility] Infectious Disease     Reason for Consult:  ESBL UTI   Referring Physician:  Estanislado Spire Date of Admission:  04/25/2016   Principal Problem:   Pneumonia Active Problems:   Sepsis [facility])   HPI: Alice Manning is a 82 y.o. female admitted 8/24 from NH, with AMS and found to have ESBL E coli UTI.  On admit her wbc was 15 and T 103.1.  UA TNTC WBC.  Started on zosyn and then changed to imipenem.  Fever down, wbc down.  Clinically responding.   Past Medical History:  Diagnosis Date  . Hyperlipemia   . Hypertension   . Mild cognitive impairment    History reviewed. No pertinent surgical history. Social History  Substance Use Topics  . Smoking status: Never Smoker  . Smokeless tobacco: Never Used  . Alcohol use No   Family History  Problem Relation Age of Onset  . Stroke Mother     Allergies: No Known Allergies  Current antibiotics: Antibiotics Given (last 72 hours)    Date/Time Action Medication Dose Rate   04/26/16 2200 Given   piperacillin-tazobactam (ZOSYN) IVPB 3.375 g 3.375 g 12.5 mL/hr   04/27/16 0536 Given   piperacillin-tazobactam (ZOSYN) IVPB 3.375 g 3.375 g 12.5 mL/hr   04/27/16 1607 Given   imipenem-cilastatin (PRIMAXIN) 500 mg in sodium chloride 0.9 % 100 mL IVPB 500 mg 200 mL/hr   04/27/16 2133 Given   imipenem-cilastatin (PRIMAXIN) 500 mg in sodium chloride 0.9 % 100 mL IVPB 500 mg 200 mL/hr   04/28/16 1005 Given   imipenem-cilastatin (PRIMAXIN) 500 mg in sodium chloride 0.9 % 100 mL IVPB 500 mg 200 mL/hr   04/28/16 1717 Given   imipenem-cilastatin (PRIMAXIN) 500 mg in sodium chloride 0.9 % 100 mL IVPB 500 mg 200 mL/hr   04/29/16 0539 Given   imipenem-cilastatin (PRIMAXIN) 500 mg in sodium chloride 0.9 % 100 mL IVPB 500 mg 200 mL/hr   04/29/16 1541 Given   imipenem-cilastatin (PRIMAXIN) 500 mg in sodium chloride 0.9 % 100 mL IVPB 500 mg 200 mL/hr      MEDICATIONS: . aspirin  81 mg Oral Daily  . diltiazem (CARDIZEM CD) 24 hr capsule 360 mg   360 mg Oral Daily  . enoxaparin (LOVENOX) injection  40 mg Subcutaneous Q24H  . escitalopram  20 mg Oral Daily  . feeding supplement (ENSURE ENLIVE)  237 mL Oral BID BM  . imipenem-cilastatin  500 mg Intravenous Q8H  . levothyroxine  75 mcg Oral QAC breakfast  . lisinopril  20 mg Oral Daily  . memantine  10 mg Oral BID  . vitamin B-12  1,000 mcg Oral Daily   Review of Systems - 11 systems reviewed and negative per HPI  OBJECTIVE: Temp:  [97.9 F (36.6 C)-98.7 F (37.1 C)] 97.9 F (36.6 C) (08/28 0421) Pulse Rate:  [66-75] 66 (08/28 0421) Resp:  [17-18] 17 (08/28 0421) BP: (129-140)/(56-58) 140/56 (08/28 0421) SpO2:  [95 %-97 %] 97 % (08/28 0421) Physical Exam  Constitutional:  Frail, disoriented HENT: [place]/AT, PERRLA, no scleral icterus Mouth/Throat: Oropharynx is clear and moist. No oropharyngeal exudate.  Cardiovascular: Normal rate, regular rhythm and normal heart sounds.  Pulmonary/Chest: Effort normal and breath sounds normal. No respiratory distress.  has no wheezes.  Neck = supple, no nuchal rigidity Abdominal: Soft. Bowel sounds are normal.  exhibits no distension. There is no tenderness.  Lymphadenopathy: no cervical adenopathy. No axillary adenopathy Neurological: alert and oriented to person, place, and time.  Skin: Skin is warm and dry. No rash noted. No erythema.  Psychiatric: a normal mood and affect.  behavior is normal.    LABS: Results for orders placed or performed during the hospital encounter of 04/25/16 (from the past 48 hour(s))  Basic metabolic panel     Status: Abnormal   Collection Time: 04/28/16  3:00 AM  Result Value Ref Range   Sodium 142 135 - 145 mmol/L   Potassium 3.9 3.5 - 5.1 mmol/L   Chloride 113 (H) 101 - 111 mmol/L   CO2 20 (L) 22 - 32 mmol/L   Glucose, Bld 100 (H) 65 - 99 mg/dL   BUN 15 6 - 20 mg/dL   Creatinine, Ser 0.84 0.44 - 1.00 mg/dL   Calcium 8.9 8.9 - 10.3 mg/dL   GFR calc non Af Amer >60 >60 mL/min   GFR calc Af Amer >60 >60  mL/min    Comment: (NOTE) The eGFR has been calculated using the CKD EPI equation. This calculation has not been validated in all clinical situations. eGFR's persistently <60 mL/min signify possible Chronic Kidney Disease.    Anion gap 9 5 - 15  CBC     Status: Abnormal   Collection Time: 04/28/16  8:18 AM  Result Value Ref Range   WBC 6.8 3.6 - 11.0 K/uL   RBC 3.26 (L) 3.80 - 5.20 MIL/uL   Hemoglobin 10.7 (L) 12.0 - 16.0 g/dL   HCT 31.3 (L) 35.0 - 47.0 %   MCV 95.9 80.0 - 100.0 fL   MCH 32.9 26.0 - 34.0 pg   MCHC 34.3 32.0 - 36.0 g/dL   RDW 14.2 11.5 - 14.5 %   Platelets 213 150 - 440 K/uL   No components found for: ESR, C REACTIVE PROTEIN MICRO: Recent Results (from the past 720 hour(s))  Blood Culture (routine x 2)     Status: None (Preliminary result)   Collection Time: 04/25/16  8:19 AM  Result Value Ref Range Status   Specimen Description BLOOD LEFT HAND  Final   Special Requests   Final    BOTTLES DRAWN AEROBIC AND ANAEROBIC AER 2ML ANA 5ML      Culture NO GROWTH 4 DAYS  Final   Report Status PENDING  Incomplete  Blood Culture (routine x 2)     Status: None (Preliminary result)   Collection Time: 04/25/16  8:19 AM  Result Value Ref Range Status   Specimen Description BLOOD RIGHT AC  Final   Special Requests BOTTLES DRAWN AEROBIC AND ANAEROBIC 9ML  Final   Culture NO GROWTH 4 DAYS  Final   Report Status PENDING  Incomplete  Urine culture     Status: Abnormal   Collection Time: 04/25/16  8:19 AM  Result Value Ref Range Status   Specimen Description URINE, RANDOM  Final   Special Requests NONE  Final   Culture (A)  Final    >=100,000 COLONIES/mL ESCHERICHIA COLI Confirmed Extended Spectrum Beta-Lactamase Producer (ESBL) Performed at [facility]    Report Status 04/27/2016 FINAL  Final   Organism ID, Bacteria ESCHERICHIA COLI (A)  Final      Susceptibility   Escherichia coli - MIC*    AMPICILLIN >=32 RESISTANT Resistant     CEFAZOLIN >=64 RESISTANT  Resistant     CEFTRIAXONE >=64 RESISTANT Resistant     CIPROFLOXACIN >=4 RESISTANT Resistant     GENTAMICIN 2 SENSITIVE Sensitive     IMIPENEM <=0.25 SENSITIVE Sensitive     NITROFURANTOIN <=16 SENSITIVE  Sensitive     TRIMETH/SULFA <=20 SENSITIVE Sensitive     AMPICILLIN/SULBACTAM >=32 RESISTANT Resistant     PIP/TAZO 8 SENSITIVE Sensitive     Extended ESBL POSITIVE Resistant     * >=100,000 COLONIES/mL ESCHERICHIA COLI  MRSA PCR Screening     Status: None   Collection Time: 04/25/16  1:14 PM  Result Value Ref Range Status   MRSA by PCR NEGATIVE NEGATIVE Final    Comment:        The GeneXpert MRSA Assay (FDA approved for NASAL specimens only), is one component of a comprehensive MRSA colonization surveillance program. It is not intended to diagnose MRSA infection nor to guide or monitor treatment for MRSA infections.     IMAGING: Dg Chest Port 1 View  Result Date: 04/25/2016 CLINICAL DATA:  Fever EXAM: PORTABLE CHEST 1 VIEW COMPARISON:  None. FINDINGS: There is a focal area of infiltrate in the right base. Lungs elsewhere are clear. Heart size and pulmonary vascularity are normal. No adenopathy. There is atherosclerotic calcification in the aorta. There is evidence of old fracture of the lateral left clavicle with remodeling. IMPRESSION: Focal area of infiltrate right base. Lungs elsewhere clear. There is aortic atherosclerosis. Followup PA and lateral chest radiographs recommended in 3-4 weeks following trial of antibiotic therapy to ensure resolution and exclude underlying malignancy. Electronically Signed   By: Lowella Grip III M.D.   On: 04/25/2016 08:54    Assessment:   Alice Manning is a 82 y.o. female with dementia admitted with ESBL E coli UTI. Possible PNA on CXR at [facility].   Lungs are crackly today.   Recommendations Can change to bactrim DS bid for 10 days for the E coli UTI Would use levofloxacin for 5 days for the PNA Thank you very much for allowing me  to participate in the care of this patient. Please call with questions.   Cheral Marker. Ola Spurr, MD

## 2016-04-29 NOTE — Progress Notes (Signed)
[employer] [facility] - [place] [facility]   PATIENT NAME: Alice SpannerGeraldine Lorincz    MR#:  [id number]  DATE OF BIRTH:  08/12/1926  SUBJECTIVE:  CHIEF COMPLAINT:   Chief Complaint  Patient presents with  . Altered Mental Status   Demented, no complaint. But has cough.  REVIEW OF SYSTEMS:   Not able to give due to dementia.  ROS  DRUG ALLERGIES:  No Known Allergies  VITALS:  Blood pressure (!) 140/56, pulse 66, temperature 97.9 F (36.6 C), temperature source Oral, resp. rate 17, height 5\' 1"  (1.549 m), weight 102 lb 9.6 oz (46.5 kg), SpO2 97 %.  PHYSICAL EXAMINATION:  GENERAL:  82 y.o.-year-old patient lying in the bed with no acute distress.  EYES: Pupils equal, round, reactive to light and accommodation. No scleral icterus. Extraocular muscles intact.  HEENT: Head atraumatic, normocephalic. Oropharynx and nasopharynx clear.  NECK:  Supple, no jugular venous distention. No thyroid enlargement, no tenderness.  LUNGS: Normal breath sounds bilaterally, no wheezing, but has bilateral crackles and rhonchi. No use of accessory muscles of respiration.  CARDIOVASCULAR: S1, S2 normal. No murmurs, rubs, or gallops.  ABDOMEN: Soft, nontender, nondistended. Bowel sounds present. No organomegaly or mass.  EXTREMITIES: No pedal edema, cyanosis, or clubbing.  NEUROLOGIC: Cranial nerves II through XII are intact. Muscle strength 3-4 /5 in all extremities. Sensation intact. Gait not checked.  PSYCHIATRIC: The patient is awake but demented. SKIN: No obvious rash, lesion, or ulcer.   Physical Exam LABORATORY PANEL:   CBC  Recent Labs Lab 04/28/16 0818  WBC 6.8  HGB 10.7*  HCT 31.3*  PLT 213   ------------------------------------------------------------------------------------------------------------------  Chemistries   Recent Labs Lab 04/25/16 0819  04/28/16 0300  NA 139  < > 142  K 4.4  < > 3.9  CL 107  < > 113*  CO2 23  < > 20*  GLUCOSE 137*  < > 100*  BUN 19  < >  15  CREATININE 0.93  < > 0.84  CALCIUM 9.0  < > 8.9  AST 33  --   --   ALT 18  --   --   ALKPHOS 89  --   --   BILITOT 0.1*  --   --   < > = values in this interval not displayed. ------------------------------------------------------------------------------------------------------------------  Cardiac Enzymes  Recent Labs Lab 04/25/16 0819  TROPONINI <0.03   ------------------------------------------------------------------------------------------------------------------  RADIOLOGY:  No results found.  ASSESSMENT AND PLAN:   Principal Problem:   Pneumonia Active Problems:   Sepsis (HCC)  * Sepsis   UTI (ESBL) and Pneumonia,  Leukocytosis improved.   She was on Vanc+ Zosyn- now MRSA PCR negative- so d/ced vanc.  Urine Cx: ESBL, bl cx negative.  Changed to imipenem, discontinued Zosyn, follow-up ID consult. On robitussin when necessary.  Hypokalemia. Give potassium supplement.  * HTN   Cont Cardizem, resumed lisinipril.  * dementia   Cont baseline meds.  * Hypothyroidism   Cont synthroid.  All the records are reviewed and case discussed with Care Management/Social Workerr. Management plans discussed with the patient's son and they are in agreement.  CODE STATUS: DNR  TOTAL TIME TAKING CARE OF THIS PATIENT: 33 minutes.    POSSIBLE D/C to SNF IN 1-2 DAYS, DEPENDING ON CLINICAL CONDITION.   Shaune Pollackhen, Dariyah Garduno M.D on 04/29/2016   Between 7am to 6pm - Pager - [phone]  After 6pm go to [email or web address] - Social research officer, governmentpassword EPAS [facility]  [facility]  Office  [phone]  CC:  Primary care physician; Randie Heinz, NP  Note: This dictation was prepared with Dragon dictation along with smaller phrase technology. Any transcriptional errors that result from this process are unintentional.

## 2016-04-29 NOTE — Progress Notes (Signed)
ANTIBIOTIC CONSULT NOTE - INITIAL  Pharmacy Consult for Levaquin  Indication: sepsis  No Known Allergies  Patient Measurements: Height: 5\' 1"  (154.9 cm) Weight: 102 lb 9.6 oz (46.5 kg) IBW/kg (Calculated) : 47.8 Adjusted Body Weight:   Vital Signs:   Intake/Output from previous day: 08/27 0701 - 08/28 0700 In: 623 [P.O.:380; IV Piggyback:243] Out: 0  Intake/Output from this shift: No intake/output data recorded.  Labs:  Recent Labs  04/28/16 0300 04/28/16 0818  WBC  --  6.8  HGB  --  10.7*  PLT  --  213  CREATININE 0.84  --    Estimated Creatinine Clearance: 34 mL/min (by C-G formula based on SCr of 0.84 mg/dL). No results for input(s): VANCOTROUGH, VANCOPEAK, VANCORANDOM, GENTTROUGH, GENTPEAK, GENTRANDOM, TOBRATROUGH, TOBRAPEAK, TOBRARND, AMIKACINPEAK, AMIKACINTROU, AMIKACIN in the last 72 hours.   Microbiology: Recent Results (from the past 720 hour(s))  Blood Culture (routine x 2)     Status: None (Preliminary result)   Collection Time: 04/25/16  8:19 AM  Result Value Ref Range Status   Specimen Description BLOOD LEFT HAND  Final   Special Requests   Final    BOTTLES DRAWN AEROBIC AND ANAEROBIC AER 2ML ANA 5ML      Culture NO GROWTH 4 DAYS  Final   Report Status PENDING  Incomplete  Blood Culture (routine x 2)     Status: None (Preliminary result)   Collection Time: 04/25/16  8:19 AM  Result Value Ref Range Status   Specimen Description BLOOD RIGHT AC  Final   Special Requests BOTTLES DRAWN AEROBIC AND ANAEROBIC 9ML  Final   Culture NO GROWTH 4 DAYS  Final   Report Status PENDING  Incomplete  Urine culture     Status: Abnormal   Collection Time: 04/25/16  8:19 AM  Result Value Ref Range Status   Specimen Description URINE, RANDOM  Final   Special Requests NONE  Final   Culture (A)  Final    >=100,000 COLONIES/mL ESCHERICHIA COLI Confirmed Extended Spectrum Beta-Lactamase Producer (ESBL) Performed at [facility]     Report Status 04/27/2016  FINAL  Final   Organism ID, Bacteria ESCHERICHIA COLI (A)  Final      Susceptibility   Escherichia coli - MIC*    AMPICILLIN >=32 RESISTANT Resistant     CEFAZOLIN >=64 RESISTANT Resistant     CEFTRIAXONE >=64 RESISTANT Resistant     CIPROFLOXACIN >=4 RESISTANT Resistant     GENTAMICIN 2 SENSITIVE Sensitive     IMIPENEM <=0.25 SENSITIVE Sensitive     NITROFURANTOIN <=16 SENSITIVE Sensitive     TRIMETH/SULFA <=20 SENSITIVE Sensitive     AMPICILLIN/SULBACTAM >=32 RESISTANT Resistant     PIP/TAZO 8 SENSITIVE Sensitive     Extended ESBL POSITIVE Resistant     * >=100,000 COLONIES/mL ESCHERICHIA COLI  MRSA PCR Screening     Status: None   Collection Time: 04/25/16  1:14 PM  Result Value Ref Range Status   MRSA by PCR NEGATIVE NEGATIVE Final    Comment:        The GeneXpert MRSA Assay (FDA approved for NASAL specimens only), is one component of a comprehensive MRSA colonization surveillance program. It is not intended to diagnose MRSA infection nor to guide or monitor treatment for MRSA infections.     Medical History: Past Medical History:  Diagnosis Date  . Hyperlipemia   . Hypertension   . Mild cognitive impairment     Medications:  Prescriptions Prior to Admission  Medication Sig Dispense  Refill Last Dose  . acetaminophen (TYLENOL) 500 MG tablet Take 1,000 mg by mouth 3 (three) times daily.   04/24/2016 at 2000  . aspirin 81 MG tablet Take 81 mg by mouth daily.   04/24/2016 at 0800  . Cranberry 450 MG CAPS Take 1 capsule by mouth daily.   04/24/2016 at 0800  . diltiazem (TIAZAC) 360 MG 24 hr capsule Take 360 mg by mouth daily.   04/24/2016 at 0800  . escitalopram (LEXAPRO) 20 MG tablet Take 20 mg by mouth daily.   04/24/2016 at 0800  . furosemide (LASIX) 40 MG tablet Take 20 mg by mouth daily.    04/24/2016 at 0800  . levothyroxine (SYNTHROID, LEVOTHROID) 75 MCG tablet Take 75 mcg by mouth daily before breakfast.   04/25/2016 at 0700  . lisinopril (PRINIVIL,ZESTRIL) 40 MG  tablet Take 40 mg by mouth daily.   04/24/2016 at 0800  . loperamide (IMODIUM) 2 MG capsule Take 2-4 mg by mouth as needed for diarrhea or loose stools. Take 4mg  after 1st loose stool then 2mg  after each subsequent loose stool (do not exceed 4 tablets in 24 hours) *if diarrhea persists longer than 24 hours call doctor*   prn at prn  . memantine (NAMENDA) 10 MG tablet Take 10 mg by mouth 2 (two) times daily.   04/24/2016 at 0800  . Menthol (ICY HOT) 5 % PTCH Apply 1 patch topically daily.   04/25/2016 at 0700  . vitamin B-12 (CYANOCOBALAMIN) 1000 MCG tablet Take 1,000 mcg by mouth daily.   04/24/2016 at 0800   Assessment: CrCl = 34 ml/min   Goal of Therapy:  resolution of infection  Plan:  Expected duration 5 days with resolution of temperature and/or normalization of WBC   Levaquin 500 mg PO X 1 to be given on 8/28 followed by levaquin 250 mg PO Q24H X 4 doses to start 8/29 @ 18:00.   Breckin Zafar D 04/29/2016,4:33 PM

## 2016-04-29 NOTE — Evaluation (Signed)
Physical Therapy Evaluation Patient Details Name: Alice Manning MRN: [id number] DOB: 09-Nov-1925 Today's Date: 04/29/2016   History of Present Illness  Pt is an 82 y.o. female presenting to hospital with AMS and fever and admitted with sepsis (UTI and PNA).  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  Clinical Impression  Prior to admission, pt was requiring assist for transfers to w/c.  Pt lives at an ALF.  Currently pt is max assist with bed mobility and with max assist unable to clear pt's bottom from bed in order to attempt transfer bed to bedside chair.  Pt would benefit from skilled PT to address noted impairments and functional limitations.  Unsure of the exact amount of level of assist required for transfers prior to admission (min vs max; 1 vs 2 person) and if pt fluctuates during the day (or day to day).  Pt may benefit from STR (pending progress and PLOF) but if pt has the assist required at ALF, pt would benefit from returning to ALF (with HHPT pending further assessment, progress, and PLOF).     Follow Up Recommendations  (STR pending progress and PLOF)    Equipment Recommendations  None recommended by PT    Recommendations for Other Services       Precautions / Restrictions Precautions Precautions: Fall Restrictions Weight Bearing Restrictions: No Other Position/Activity Restrictions: h/o L chronic distal radius fx and L clavicle fx      Mobility  Bed Mobility Overal bed mobility: Needs Assistance Bed Mobility: Supine to Sit;Sit to Supine     Supine to sit: Max assist Sit to supine: Mod assist;+2 for physical assistance      Transfers Overall transfer level: Needs assistance Equipment used: None             General transfer comment: Attempted sit to stand for transfers but with multiple attempts with variable cueing techniques unable to get pt's bottom off of the bed (pt either picking  feet up off of floor when attempting to stand or pt would not initiate when feet stayed on floor--pt instead voicing concerns of being afraid to fall or asking other questions repeatedly that were asked by pt and then answered by PT during the session already); max assist of therapist required for attempts.  Ambulation/Gait             General Gait Details: not appropriate to assess  Stairs            Wheelchair Mobility    Modified Rankin (Stroke Patients Only)       Balance Overall balance assessment: Needs assistance Sitting-balance support: Single extremity supported;Feet supported Sitting balance-Leahy Scale: Poor Sitting balance - Comments: pt requiring cueing to sit upright and keep feet on ground Postural control: Right lateral lean;Posterior lean                                   Pertinent Vitals/Pain Pain Assessment: No/denies pain  O2 90-91% during session on RA.    Home Living Family/patient expects to be discharged to:: Assisted living [facility])               Home Equipment: Wheelchair - manual      Prior Function Level of Independence: Needs assistance         Comments: Per SW, pt requiring assist for  transfers but primarily uses w/c and can peddle to move w/c (pt using w/c secondary to pt falling often otherwise)     Hand Dominance        Extremity/Trunk Assessment   Upper Extremity Assessment: RUE deficits/detail;LUE deficits/detail RUE Deficits / Details: generalized weakness     LUE Deficits / Details: L hand 3-5th finger flexed (unable to extend--unsure if contracted or pt resisting); L thumb and 2nd finger able to partially extend   Lower Extremity Assessment: Generalized weakness;Difficult to assess due to impaired cognition (B heelcord contractures (lacking DF grossly 30 degrees from neutral))         Communication   Communication: No difficulties  Cognition Arousal/Alertness:  Awake/alert Behavior During Therapy: Anxious (concerned of people coming to get her and wanting to find hiding spot) Overall Cognitive Status:  (Oriented to person)       Memory: Decreased recall of precautions              General Comments General comments (skin integrity, edema, etc.): just distal to L lateral knee 2 objects pushing against skin (from under skin): question if pins from prior surgery?    Exercises Total Joint Exercises Ankle Circles/Pumps: AAROM;Strengthening;Both;10 reps;Supine Quad Sets: AROM;Strengthening;Both;10 reps;Supine;AAROM Short Arc Quad: AAROM;Strengthening;Both;10 reps;Supine Heel Slides: AAROM;Strengthening;Both;10 reps;Supine Hip ABduction/ADduction: AAROM;Strengthening;Both;10 reps;Supine      Assessment/Plan    PT Assessment Patient needs continued PT services  PT Diagnosis Difficulty walking;Generalized weakness   PT Problem List Decreased strength;Decreased range of motion;Decreased balance;Decreased mobility  PT Treatment Interventions DME instruction;Functional mobility training;Therapeutic activities;Therapeutic exercise;Balance training;Patient/family education;Wheelchair mobility training   PT Goals (Current goals can be found in the Care Plan section) Acute Rehab PT Goals Patient Stated Goal: to be able to transfer to chair PT Goal Formulation: With patient Time For Goal Achievement: 05/13/16 Potential to Achieve Goals: Fair    Frequency Min 2X/week   Barriers to discharge   Question level of assist    Co-evaluation               End of Session Equipment Utilized During Treatment: Gait belt Activity Tolerance: Patient tolerated treatment well Patient left: in bed;with call bell/phone within reach;with bed alarm set Nurse Communication: Mobility status;Precautions         Time: [id number] PT Time Calculation (min) (ACUTE ONLY): 27 min   Charges:   PT Evaluation $PT Eval Low Complexity: 1 Procedure PT  Treatments $Therapeutic Exercise: 8-22 mins   PT G CodesHendricks Manning:        Alice Manning 04/29/2016, 5:09 PM Alice LimesEmily Latrell Manning, PT [phone]

## 2016-04-29 NOTE — Clinical Social Work Note (Signed)
Physician stated patient with crackles today and anticipate a discharge [facility]. CSW [facility] [place] [facility] and informed her of this. Patient is currently on 2 liters oxygen which is new for her. Will see if she can be weaned prior to discharge.  York SpanielMonica Jazarah Capili MSW,LCSW [phone]

## 2016-04-30 LAB — CULTURE, BLOOD (ROUTINE X 2)
CULTURE: NO GROWTH
Culture: NO GROWTH

## 2016-04-30 MED ORDER — LEVOFLOXACIN 250 MG PO TABS: 250.0000 mg | ORAL_TABLET | Freq: Every day | ORAL | 0 refills | Status: DC

## 2016-04-30 MED ORDER — GUAIFENESIN-CODEINE 100-10 MG/5ML PO SOLN: 10.0000 mL | Freq: Four times a day (QID) | ORAL | 0 refills | Status: DC | PRN

## 2016-04-30 MED ORDER — SULFAMETHOXAZOLE-TRIMETHOPRIM 800-160 MG PO TABS: 1.0000 | ORAL_TABLET | Freq: Two times a day (BID) | ORAL | 0 refills | Status: DC

## 2016-04-30 NOTE — Clinical Social Work Note (Signed)
Patient to be d/c'ed today [facility].  Patient and family agreeable to plans will transport via ems RN to call report.  Windell MouldingEric Alliah Boulanger, MSW Mon-Fri 8a-4:30p [phone]

## 2016-04-30 NOTE — [place] FL2 (Signed)
[place] MEDICAID FL2 LEVEL OF CARE SCREENING TOOL     IDENTIFICATION  Patient Name: Alice Manning Birthdate: 07/21/1926 Sex: female Admission Date (Current Location): 04/25/2016  [place]   Facility and Address:  [facility], [address], [place], [place] [address]      Provider Number: [id number]  Attending Physician Name and Address:  Shaune PollackQing Marcheta Horsey, MD  Relative Name and Phone Number:       Current Level of Care: Hospital Recommended Level of Care: Assisted Living Facility, Memory Care Prior Approval Number:    Date Approved/Denied:   PASRR Number:    Discharge Plan: Domiciliary (Rest home)    Current Diagnoses: Patient Active Problem List   Diagnosis Date Noted  . Sepsis (HCC) 04/25/2016  . Pneumonia 04/25/2016    Orientation RESPIRATION BLADDER Height & Weight     Self  Normal Incontinent Weight: 102 lb 9.6 oz (46.5 kg) Height:  5\' 1"  (154.9 cm)  BEHAVIORAL SYMPTOMS/MOOD NEUROLOGICAL BOWEL NUTRITION STATUS   (none )  (none) Continent Diet (Thin liquid;Dysphagia 1 (Puree) )  AMBULATORY STATUS COMMUNICATION OF NEEDS Skin   Extensive Assist (Primarily uses a wheel chair. ) Verbally Normal                       Personal Care Assistance Level of Assistance  Bathing, Feeding, Dressing Bathing Assistance: Limited assistance Feeding assistance: Limited assistance Dressing Assistance: Limited assistance     Functional Limitations Info  Sight, Hearing, Speech Sight Info: Adequate Hearing Info: Adequate Speech Info: Adequate    SPECIAL CARE FACTORS FREQUENCY                       Contractures      Additional Factors Info  Code Status, Allergies, Isolation Precautions Code Status Info:  (DNR ) Allergies Info:  (No Known Allergies. )     Isolation Precautions Info:  (ESBL )     Current Medications (04/30/2016):  This is the current hospital active medication list Current  Facility-Administered Medications  Medication Dose Route Frequency Provider Last Rate Last Dose  . acetaminophen (TYLENOL) suppository 650 mg  650 mg Rectal Q4H PRN Altamese DillingVaibhavkumar Vachhani, MD   650 mg at 04/25/16 2148  . acetaminophen (TYLENOL) tablet 650 mg  650 mg Oral Q6H PRN Altamese DillingVaibhavkumar Vachhani, MD      . aspirin chewable tablet 81 mg  81 mg Oral Daily Altamese DillingVaibhavkumar Vachhani, MD   81 mg at 04/30/16 1020  . diltiazem (CARDIZEM CD) 24 hr capsule 360 mg  360 mg Oral Daily Altamese DillingVaibhavkumar Vachhani, MD   360 mg at 04/30/16 1019  . enoxaparin (LOVENOX) injection 40 mg  40 mg Subcutaneous Q24H Altamese DillingVaibhavkumar Vachhani, MD   40 mg at 04/28/16 2316  . escitalopram (LEXAPRO) tablet 20 mg  20 mg Oral Daily Altamese DillingVaibhavkumar Vachhani, MD   20 mg at 04/30/16 1019  . feeding supplement (ENSURE ENLIVE) (ENSURE ENLIVE) liquid 237 mL  237 mL Oral BID BM Shaune PollackQing Vikash Nest, MD   237 mL at 04/30/16 1021  . guaiFENesin-codeine 100-10 MG/5ML solution 10 mL  10 mL Oral Q6H PRN Shaune PollackQing Avedis Bevis, MD   10 mL at 04/29/16 1110  . levofloxacin (LEVAQUIN) tablet 250 mg  250 mg Oral Daily Shaune PollackQing Kelyn Koskela, MD      . levothyroxine (SYNTHROID, LEVOTHROID) tablet 75 mcg  75 mcg Oral QAC breakfast Altamese DillingVaibhavkumar Vachhani, MD   75 mcg at 04/30/16 1020  .  lisinopril (PRINIVIL,ZESTRIL) tablet 20 mg  20 mg Oral Daily Shaune Pollack, MD   20 mg at 04/30/16 1020  . memantine (NAMENDA) tablet 10 mg  10 mg Oral BID Altamese Dilling, MD   10 mg at 04/30/16 1020  . sulfamethoxazole-trimethoprim (BACTRIM DS,SEPTRA DS) 800-160 MG per tablet 1 tablet  1 tablet Oral Q12H Mick Sell, MD   1 tablet at 04/30/16 1020  . vitamin B-12 (CYANOCOBALAMIN) tablet 1,000 mcg  1,000 mcg Oral Daily Altamese Dilling, MD   1,000 mcg at 04/30/16 1020     Discharge Medications: Please see discharge summary for a list of discharge medications.  Relevant Imaging Results:  Relevant Lab Results:   Additional Information  (SSN: [id number])  Darleene Cleaver

## 2016-04-30 NOTE — Care Management (Signed)
Patient to discharge and [facility].  Patient ordered home health PT, and RN.  [facility] with [facility] who states that their [facility].  Discussed discharge disposition with patient's daughter in law [place].  Per Lynden Angathy there is no preference for home health agency.  [facility] Family has requested EMS transfer at discharge.  Eric CSW notified, RNCM signing off

## 2016-04-30 NOTE — Discharge Instructions (Signed)
Heart healthy diet. °Activity as tolerated. °Fall and aspiration precaution. °

## 2016-04-30 NOTE — Discharge Summary (Addendum)
[facility] [place] [facility]   PATIENT NAME: Alice SpannerGeraldine Manning    MR#:  [id number]  DATE OF BIRTH:  05/03/1926  DATE OF ADMISSION:  04/25/2016   ADMITTING PHYSICIAN: Altamese DillingVaibhavkumar Vachhani, MD  DATE OF DISCHARGE: 04/30/2016 PRIMARY CARE PHYSICIAN: Randie HeinzAnne Marie Mukamana, NP   ADMISSION DIAGNOSIS:  UTI (lower urinary tract infection) [N39.0] Community acquired pneumonia [J18.9] DISCHARGE DIAGNOSIS:  Principal Problem:   Pneumonia Active Problems:   Sepsis (HCC) PNA  UTI (ESBL) SECONDARY DIAGNOSIS:   Past Medical History:  Diagnosis Date  . Hyperlipemia   . Hypertension   . Mild cognitive impairment    HOSPITAL COURSE:   * Sepsis UTI (ESBL) and Pneumonia,  Leukocytosis improved.  She was on Vanc+ Zosyn- now MRSA PCR negative- so d/ced vanc.  Urine Cx: ESBL, bl cx negative.  Changed to imipenem, discontinued Zosyn, changed to bactrim for 10 days and levaquin for 5 days per Dr. Sampson GoonFitzgerald, ID consult. On robitussin when necessary.  Acute metabolic encephalopathy, due to sepsis with UTI.  Hypokalemia. Give potassium supplement. Improved.  * HTN Cont Cardizem, resumed lisinipril.  * dementia Cont baseline meds.  * Hypothyroidism   Cont synthroid.  Discussed with Dr. Sampson GoonFitzgerald.  DISCHARGE CONDITIONS:  Stable, discharge to ALF with HHPT today. CONSULTS OBTAINED:  Treatment Team:  Mick Sellavid P Fitzgerald, MD DRUG ALLERGIES:  No Known Allergies DISCHARGE MEDICATIONS:     Medication List    TAKE these medications   acetaminophen 500 MG tablet Commonly known as:  TYLENOL Take 1,000 mg by mouth 3 (three) times daily.   aspirin 81 MG tablet Take 81 mg by mouth daily.   Cranberry 450 MG Caps Take 1 capsule by mouth daily.   diltiazem 360 MG 24 hr capsule Commonly known as:  TIAZAC Take 360 mg by mouth daily.   escitalopram 20 MG tablet Commonly known as:  LEXAPRO Take 20 mg by mouth daily.   furosemide 40 MG  tablet Commonly known as:  LASIX Take 20 mg by mouth daily.   guaiFENesin-codeine 100-10 MG/5ML syrup Take 10 mLs by mouth every 6 (six) hours as needed for cough.   ICY HOT 5 % Ptch Generic drug:  Menthol Apply 1 patch topically daily.   levofloxacin 250 MG tablet Commonly known as:  LEVAQUIN Take 1 tablet (250 mg total) by mouth daily.   levothyroxine 75 MCG tablet Commonly known as:  SYNTHROID, LEVOTHROID Take 75 mcg by mouth daily before breakfast.   lisinopril 40 MG tablet Commonly known as:  PRINIVIL,ZESTRIL Take 40 mg by mouth daily.   loperamide 2 MG capsule Commonly known as:  IMODIUM Take 2-4 mg by mouth as needed for diarrhea or loose stools. Take 4mg  after 1st loose stool then 2mg  after each subsequent loose stool (do not exceed 4 tablets in 24 hours) *if diarrhea persists longer than 24 hours call doctor*   memantine 10 MG tablet Commonly known as:  NAMENDA Take 10 mg by mouth 2 (two) times daily.   sulfamethoxazole-trimethoprim 800-160 MG tablet Commonly known as:  BACTRIM DS,SEPTRA DS Take 1 tablet by mouth every 12 (twelve) hours.   vitamin B-12 1000 MCG tablet Commonly known as:  CYANOCOBALAMIN Take 1,000 mcg by mouth daily.        DISCHARGE INSTRUCTIONS:   DIET:  Heart healthy diet. DISCHARGE CONDITION:  Stable. ACTIVITY:  As tolerated DISCHARGE LOCATION:    If you experience worsening of your admission symptoms, develop shortness of breath, life threatening emergency, suicidal or  homicidal thoughts you must seek medical attention immediately by calling 911 or calling your MD immediately  if symptoms less severe.  You Must read complete instructions/literature along with all the possible adverse reactions/side effects for all the Medicines you take and that have been prescribed to you. Take any new Medicines after you have completely understood and accpet all the possible adverse reactions/side effects.   Please note  You were cared for by  a hospitalist during your hospital stay. If you have any questions about your discharge medications or the care you received while you were in the hospital after you are discharged, you can call the unit and asked to speak with the hospitalist on call if the hospitalist that took care of you is not available. Once you are discharged, your primary care physician will handle any further medical issues. Please note that NO REFILLS for any discharge medications will be authorized once you are discharged, as it is imperative that you return to your primary care physician (or establish a relationship with a primary care physician if you do not have one) for your aftercare needs so that they can reassess your need for medications and monitor your lab values.    On the day of Discharge:  VITAL SIGNS:  Blood pressure 122/68, pulse 81, temperature 98.9 F (37.2 C), temperature source Oral, resp. rate 18, height 5\' 1"  (1.549 m), weight 102 lb 9.6 oz (46.5 kg), SpO2 93 %. PHYSICAL EXAMINATION:  GENERAL:  82 y.o.-year-old patient lying in the bed with no acute distress.  EYES: Pupils equal, round, reactive to light and accommodation. No scleral icterus. Extraocular muscles intact.  HEENT: Head atraumatic, normocephalic. Oropharynx and nasopharynx clear.  NECK:  Supple, no jugular venous distention. No thyroid enlargement, no tenderness.  LUNGS: Normal breath sounds bilaterally, no wheezing, mild crackles. No use of accessory muscles of respiration.  CARDIOVASCULAR: S1, S2 normal. No murmurs, rubs, or gallops.  ABDOMEN: Soft, non-tender, non-distended. Bowel sounds present. No organomegaly or mass.  EXTREMITIES: No pedal edema, cyanosis, or clubbing.  NEUROLOGIC: Cranial nerves II through XII are intact. Muscle strength 4/5 in all extremities. Sensation intact. Gait not checked.  PSYCHIATRIC: The patient is demented. SKIN: No obvious rash, lesion, or ulcer.  DATA REVIEW:   CBC  Recent Labs Lab  04/28/16 0818  WBC 6.8  HGB 10.7*  HCT 31.3*  PLT 213    Chemistries   Recent Labs Lab 04/25/16 0819  04/28/16 0300  NA 139  < > 142  K 4.4  < > 3.9  CL 107  < > 113*  CO2 23  < > 20*  GLUCOSE 137*  < > 100*  BUN 19  < > 15  CREATININE 0.93  < > 0.84  CALCIUM 9.0  < > 8.9  AST 33  --   --   ALT 18  --   --   ALKPHOS 89  --   --   BILITOT 0.1*  --   --   < > = values in this interval not displayed.   Microbiology Results  Results for orders placed or performed during the hospital encounter of 04/25/16  Blood Culture (routine x 2)     Status: None   Collection Time: 04/25/16  8:19 AM  Result Value Ref Range Status   Specimen Description BLOOD LEFT HAND  Final   Special Requests   Final    BOTTLES DRAWN AEROBIC AND ANAEROBIC AER ANA  Culture NO GROWTH 5 DAYS  Final   Report Status 04/30/2016 FINAL  Final  Blood Culture (routine x 2)     Status: None   Collection Time: 04/25/16  8:19 AM  Result Value Ref Range Status   Specimen Description BLOOD RIGHT AC  Final   Special Requests BOTTLES DRAWN AEROBIC AND ANAEROBIC  Final   Culture NO GROWTH 5 DAYS  Final   Report Status 04/30/2016 FINAL  Final  Urine culture     Status: Abnormal   Collection Time: 04/25/16  8:19 AM  Result Value Ref Range Status   Specimen Description URINE, RANDOM  Final   Special Requests NONE  Final   Culture (A)  Final    >=100,000 COLONIES/mL ESCHERICHIA COLI Confirmed Extended Spectrum Beta-Lactamase Producer (ESBL) Performed at [facility]    Report Status 04/27/2016 FINAL  Final   Organism ID, Bacteria ESCHERICHIA COLI (A)  Final      Susceptibility   Escherichia coli - MIC*    AMPICILLIN >=32 RESISTANT Resistant     CEFAZOLIN >=64 RESISTANT Resistant     CEFTRIAXONE >=64 RESISTANT Resistant     CIPROFLOXACIN >=4 RESISTANT Resistant     GENTAMICIN 2 SENSITIVE Sensitive     IMIPENEM <=0.25 SENSITIVE Sensitive     NITROFURANTOIN <=16 SENSITIVE Sensitive      TRIMETH/SULFA <=20 SENSITIVE Sensitive     AMPICILLIN/SULBACTAM >=32 RESISTANT Resistant     PIP/TAZO 8 SENSITIVE Sensitive     Extended ESBL POSITIVE Resistant     * >=100,000 COLONIES/mL ESCHERICHIA COLI  MRSA PCR Screening     Status: None   Collection Time: 04/25/16  1:14 PM  Result Value Ref Range Status   MRSA by PCR NEGATIVE NEGATIVE Final    Comment:        The GeneXpert MRSA Assay (FDA approved for NASAL specimens only), is one component of a comprehensive MRSA colonization surveillance program. It is not intended to diagnose MRSA infection nor to guide or monitor treatment for MRSA infections.     RADIOLOGY:  No results found.   Management plans discussed with the patient, family and they are in agreement.  CODE STATUS:     Code Status Orders        Start     Ordered   04/25/16 1125  Do not attempt resuscitation (DNR)  Continuous    Question Answer Comment  In the event of cardiac or respiratory ARREST Do not call a "code blue"   In the event of cardiac or respiratory ARREST Do not perform Intubation, CPR, defibrillation or ACLS   In the event of cardiac or respiratory ARREST Use medication by any route, position, wound care, and other measures to relive pain and suffering. May use oxygen, suction and manual treatment of airway obstruction as needed for comfort.      04/25/16 1124    Code Status History    Date Active Date Inactive Code Status Order ID Comments User Context   This patient has a current code status but no historical code status.      TOTAL TIME TAKING CARE OF THIS PATIENT: 36 minutes.    Shaune Pollack M.D on 04/30/2016 at 10:28 AM  Between 7am to 6pm - Pager - [phone]  After 6pm go to [email or web address] - Scientist, research (life sciences) [facility]  Office  [phone]  CC: Primary care physician; Randie Heinz, NP   Note: This dictation was prepared with Dragon dictation along  with smaller phrase technology.  Any transcriptional errors that result from this process are unintentional.

## 2016-04-30 NOTE — Discharge Summary (Addendum)
Patient IV x2 removed.  Discharge papers given, explained and educated.  [facility] and S/W Harmon DunKaren McSwain, Med Tech (per her request).  Packet with patient to facility.  Family notified of discharge and transport to facility. EMS contacted and will transport when available. RN assessment and VS revealed stability for Discharge to facility.  EMS arrival 1630.

## 2016-05-08 ENCOUNTER — Emergency Department
Admission: EM | Admit: 2016-05-08 | Discharge: 2016-05-08 | Disposition: A | Discharge status: Home / Self Care | Payer: Medicare Other | Attending: Emergency Medicine | Admitting: Emergency Medicine

## 2016-05-08 ENCOUNTER — Emergency Department: Payer: Medicare Other

## 2016-05-08 DIAGNOSIS — Z23 Encounter for immunization: Secondary | ICD-10-CM | POA: Diagnosis not present

## 2016-05-08 DIAGNOSIS — Y9389 Activity, other specified: Secondary | ICD-10-CM | POA: Insufficient documentation

## 2016-05-08 DIAGNOSIS — J329 Chronic sinusitis, unspecified: Secondary | ICD-10-CM | POA: Diagnosis not present

## 2016-05-08 DIAGNOSIS — W19XXXA Unspecified fall, initial encounter: Secondary | ICD-10-CM

## 2016-05-08 DIAGNOSIS — S0101XA Laceration without foreign body of scalp, initial encounter: Secondary | ICD-10-CM | POA: Diagnosis present

## 2016-05-08 DIAGNOSIS — I10 Essential (primary) hypertension: Secondary | ICD-10-CM | POA: Insufficient documentation

## 2016-05-08 DIAGNOSIS — Z79899 Other long term (current) drug therapy: Secondary | ICD-10-CM | POA: Insufficient documentation

## 2016-05-08 DIAGNOSIS — Z7982 Long term (current) use of aspirin: Secondary | ICD-10-CM | POA: Insufficient documentation

## 2016-05-08 DIAGNOSIS — W01198A Fall on same level from slipping, tripping and stumbling with subsequent striking against other object, initial encounter: Secondary | ICD-10-CM | POA: Diagnosis not present

## 2016-05-08 DIAGNOSIS — Y999 Unspecified external cause status: Secondary | ICD-10-CM | POA: Diagnosis not present

## 2016-05-08 DIAGNOSIS — F0390 Unspecified dementia without behavioral disturbance: Secondary | ICD-10-CM | POA: Insufficient documentation

## 2016-05-08 DIAGNOSIS — Y92128 Other place in nursing home as the place of occurrence of the external cause: Secondary | ICD-10-CM | POA: Insufficient documentation

## 2016-05-08 HISTORY — DX: Disorder of thyroid, unspecified: E07.9

## 2016-05-08 MED ORDER — AMOXICILLIN-POT CLAVULANATE 875-125 MG PO TABS: 1.0000 | ORAL_TABLET | Freq: Two times a day (BID) | ORAL | 0 refills | Status: AC

## 2016-05-08 MED ORDER — TETANUS-DIPHTH-ACELL PERTUSSIS 5-2.5-18.5 LF-MCG/0.5 IM SUSP
0.5000 mL | Freq: Once | INTRAMUSCULAR | Status: AC
Start: 2016-05-08 — End: 2016-05-08
  Administered 2016-05-08: 0.5 mL via INTRAMUSCULAR
  Filled 2016-05-08: qty 0.5

## 2016-05-08 NOTE — ED Notes (Signed)
MD at bedside to apply glue to laceration on head; will monitor further bleeding. Area cleansed prior to glue placement.

## 2016-05-08 NOTE — ED Provider Notes (Signed)
[facility] Emergency Department Provider Note   ____________________________________________   First MD Initiated Contact with Patient 05/08/16 1030     (approximate)  I have reviewed the triage vital signs and the nursing notes.   HISTORY  Chief Complaint Head Laceration    HPI Alice Manning is a 82 y.o. female who had a witnessed fall at the nursing home. Reportedly she tripped over someone else's wheelchair and hit her head. Was no loss of consciousness. Patient has approximately 1 inch long laceration at the top of her head. It is not bleeding at present. It is not deep.   Past Medical History:  Diagnosis Date  . Hyperlipemia   . Hypertension   . Mild cognitive impairment   . Thyroid disease     Patient Active Problem List   Diagnosis Date Noted  . Sepsis (HCC) 04/25/2016  . Pneumonia 04/25/2016    No past surgical history on file.  Prior to Admission medications   Medication Sig Start Date End Date Taking? Authorizing Provider  acetaminophen (TYLENOL) 500 MG tablet Take 1,000 mg by mouth 3 (three) times daily.   Yes Historical Provider, MD  aspirin 81 MG chewable tablet Take 81 mg by mouth daily.   Yes Historical Provider, MD  Cranberry 450 MG CAPS Take 450 mg by mouth daily.    Yes Historical Provider, MD  diltiazem (TIAZAC) 360 MG 24 hr capsule Take 360 mg by mouth daily.   Yes Historical Provider, MD  escitalopram (LEXAPRO) 20 MG tablet Take 20 mg by mouth daily.   Yes Historical Provider, MD  furosemide (LASIX) 40 MG tablet Take 20 mg by mouth daily.    Yes Historical Provider, MD  guaiFENesin (MUCINEX) 600 MG 12 hr tablet Take 600 mg by mouth 2 (two) times daily.   Yes Historical Provider, MD  guaiFENesin-codeine 100-10 MG/5ML syrup Take 10 mLs by mouth every 6 (six) hours as needed for cough. 04/30/16  Yes Shaune PollackQing Chen, MD  levothyroxine (SYNTHROID, LEVOTHROID) 75 MCG tablet Take 75 mcg by mouth daily before breakfast.   Yes  Historical Provider, MD  lisinopril (PRINIVIL,ZESTRIL) 40 MG tablet Take 40 mg by mouth daily.   Yes Historical Provider, MD  memantine (NAMENDA) 10 MG tablet Take 10 mg by mouth 2 (two) times daily.   Yes Historical Provider, MD  Menthol (ICY HOT) 5 % PTCH Apply 1 patch topically daily.   Yes Historical Provider, MD  sulfamethoxazole-trimethoprim (BACTRIM DS,SEPTRA DS) 800-160 MG tablet Take 1 tablet by mouth every 12 (twelve) hours. Patient taking differently: Take 1 tablet by mouth every 12 (twelve) hours. For 10 days 04/30/16  Yes Shaune PollackQing Chen, MD  vitamin B-12 (CYANOCOBALAMIN) 1000 MCG tablet Take 1,000 mcg by mouth daily.   Yes Historical Provider, MD  levofloxacin (LEVAQUIN) 250 MG tablet Take 1 tablet (250 mg total) by mouth daily. Patient not taking: Reported on 05/08/2016 04/30/16   Shaune PollackQing Chen, MD  loperamide (IMODIUM) 2 MG capsule Take 2-4 mg by mouth as needed for diarrhea or loose stools. Take 4mg  after 1st loose stool then 2mg  after each subsequent loose stool (do not exceed 4 tablets in 24 hours) *if diarrhea persists longer than 24 hours call doctor*    Historical Provider, MD    Allergies Review of patient's allergies indicates no known allergies.  Family History  Problem Relation Age of Onset  . Stroke Mother     Social History Social History  Substance Use Topics  . Smoking status: Never  Smoker  . Smokeless tobacco: Never Used  . Alcohol use No    Review of Systems Constitutional: No fever/chills Eyes: No visual changes. ENT: No sore throat. Cardiovascular: Denies chest pain. Respiratory: Denies shortness of breath. Gastrointestinal: No abdominal pain.  No nausea, no vomiting.  No diarrhea.  No constipation. Genitourinary: Negative for dysuria. Musculoskeletal: Negative for back pain. Skin: Negative for rash. Neurological: Negative for focal weakness or numbness.  10-point ROS otherwise negative.  ____________________________________________   PHYSICAL  EXAM:  VITAL SIGNS: ED Triage Vitals [05/08/16 1028]  Enc Vitals Group     BP (!) 131/58     Pulse Rate 67     Resp 16     Temp 97.7 F (36.5 C)     Temp Source Oral     SpO2 95 %     Weight 102 lb (46.3 kg)     Height 5\' 1"  (1.549 m)     Head Circumference      Peak Flow      Pain Score      Pain Loc      Pain Edu?      Excl. in GC?     Constitutional: Alert and oriented. Well appearing and in no acute distress. Eyes: Conjunctivae are normal. PERRL. EOMI. Head: Atraumatic Except for laceration as described. Nose: No congestion/rhinnorhea. Mouth/Throat: Mucous membranes are moist.  Oropharynx non-erythematous. Neck: No stridor. Cardiovascular: Normal rate, regular rhythm. Grossly normal heart sounds.  Good peripheral circulation. Respiratory: Normal respiratory effort.  No retractions. Lungs CTAB. Gastrointestinal: Soft and nontender. No distention. No abdominal bruits. No CVA tenderness. Musculoskeletal: No lower extremity tenderness nor edema.  No joint effusions. Neurologic:  Normal speech and language. No gross focal neurologic deficits are appreciated. No gait instability. Skin:  Skin is warm, dry and intact. No rash noted.   ____________________________________________   LABS (all labs ordered are listed, but only abnormal results are displayed)  Labs Reviewed - No data to display ____________________________________________  EKG   ____________________________________________  RADIOLOGY  Result   CLINICAL DATA:  AAA dementia.  Fall, hit head.  EXAM: CT HEAD WITHOUT CONTRAST  CT CERVICAL SPINE WITHOUT CONTRAST  TECHNIQUE: Multidetector CT imaging of the head and cervical spine was performed following the standard protocol without intravenous contrast. Multiplanar CT image reconstructions of the cervical spine were also generated.  COMPARISON:  Hit a 03/03/2016  FINDINGS: CT HEAD FINDINGS  Brain: There is atrophy and chronic small  vessel disease changes. No acute intracranial abnormality. Specifically, no hemorrhage, hydrocephalus, mass lesion, acute infarction, or significant intracranial injury.  Vascular: No hyperdense vessel or unexpected calcification.  Skull: No acute calvarial abnormality.  Sinuses/Orbits: Air-fluid levels are noted in the maxillary sinuses pain laterally, nose prior study. Mastoid air cells are clear. Orbital soft tissues unremarkable.  Other: None  CT CERVICAL SPINE FINDINGS  Alignment: Slight anterolisthesis of C4 on C5 related to set disease.  Skull base and vertebrae: Degenerative disc disease at C5-6 with disc space narrowing and spurring. No fracture.  Soft tissues and spinal canal: Prevertebral soft tissues are normal. No epidural or paraspinal hematoma.  Disc levels:  Disc space narrowing at C5-6.  Upper chest: Scarring in the apices.  Other:  IMPRESSION: No acute intracranial abnormality.Atrophy, chronic microvascular disease.  Cervical spondylosis.  No acute bony abnormality.   Electronically Signed   By: Charlett Nose M.D.   On: 05/08/2016 11:33     ____________________________________________   PROCEDURES  Procedure(s) performed:   Procedures  Critical Care performed:   ____________________________________________   INITIAL IMPRESSION / ASSESSMENT AND PLAN / ED COURSE  Pertinent labs & imaging results that were available during my care of the patient were reviewed by me and considered in my medical decision making (see chart for details).   Patient does not complain of any facial pain. There are no marks there consistent with bruising or occult fracture. I will treat her for sinusitis.  Clinical Course     ____________________________________________   FINAL CLINICAL IMPRESSION(S) / ED DIAGNOSES  Final diagnoses:  Scalp laceration, initial encounter  Fall, initial encounter  Sinusitis, unspecified chronicity,  unspecified location      NEW MEDICATIONS STARTED DURING THIS VISIT:  New Prescriptions   No medications on file     Note:  This document was prepared using Dragon voice recognition software and may include unintentional dictation errors.    Arnaldo Natal, MD 05/08/16 1200

## 2016-05-08 NOTE — ED Notes (Signed)
[facility].

## 2016-05-08 NOTE — ED Notes (Signed)
Pt assisted to bathroom, unable to stand or ambulate without assistance. PT given warm blankets. Awaiting EMS arrival.

## 2016-05-08 NOTE — Discharge Instructions (Signed)
Keep the scalp clean and dry. He may shower blot dry do not rub afterwards. Skin glue should come off by itself after several days. Please return for any further problems. Patient has some air-fluid levels in her sinus on CT I will give her Augmentin one twice a day to treat that.

## 2016-05-08 NOTE — ED Notes (Signed)
approx 2 inch laceration to top of head, edges approximated, no bleeding at this time. No LOC. Hematoma overtop of left eye.

## 2016-05-08 NOTE — ED Notes (Signed)
Awaiting EMS arrival. [facility] states that she is unsure if anyone can take patient back. Will need to call back after speaking with another staff member who can answer question.

## 2016-05-08 NOTE — ED Notes (Signed)
Patient transported to CT 

## 2016-05-08 NOTE — ED Notes (Signed)
[facility]. Pt left with her shoes and paperwork that was sent with her here.

## 2016-05-08 NOTE — ED Notes (Signed)
EMS, [facility] , "severe Dementia" tripped , lac to scalp , bleeding controled, No LOC , 148/56, HR 68, SATS 96%, " at baseline "

## 2016-05-08 NOTE — ED Notes (Signed)
Pt in stretcher, side rails up X 2. Visible from nurses station. Pt eyes closed, appears to be resting.

## 2016-05-08 NOTE — ED Notes (Addendum)
Spoke with [facility], Shanda BumpsJessica who states that she is unsure if they are able to transport pt back. Left on hold for approx 5 minutes, back onto phone and states that they will have to call back to ER and let us know. No call back at this time. Has been approx 30 minutes. Will arrange EMS transport.

## 2016-06-25 ENCOUNTER — Other Ambulatory Visit
Admission: RE | Admit: 2016-06-25 | Discharge: 2016-06-25 | Disposition: A | Discharge status: Home / Self Care | Payer: Medicare Other | Source: Skilled Nursing Facility | Attending: Nurse Practitioner | Admitting: Nurse Practitioner

## 2016-06-25 DIAGNOSIS — N390 Urinary tract infection, site not specified: Secondary | ICD-10-CM | POA: Insufficient documentation

## 2016-06-25 LAB — URINALYSIS COMPLETE WITH MICROSCOPIC (ARMC ONLY)
BILIRUBIN URINE: NEGATIVE
GLUCOSE, UA: NEGATIVE mg/dL
Ketones, ur: NEGATIVE mg/dL
NITRITE: POSITIVE — AB
Protein, ur: NEGATIVE mg/dL
RBC / HPF: NONE SEEN RBC/hpf (ref 0–5)
SPECIFIC GRAVITY, URINE: 1.015 (ref 1.005–1.030)
pH: 7 (ref 5.0–8.0)

## 2016-06-27 LAB — URINE CULTURE

## 2016-07-26 ENCOUNTER — Inpatient Hospital Stay
Admission: EM | Admit: 2016-07-26 | Discharge: 2016-07-31 | DRG: 682 | Disposition: A | Discharge status: Skilled Nursing Facility | Payer: Medicare Other | Attending: Internal Medicine | Admitting: Internal Medicine

## 2016-07-26 ENCOUNTER — Encounter: Payer: Self-pay | Admitting: Emergency Medicine

## 2016-07-26 DIAGNOSIS — Z79899 Other long term (current) drug therapy: Secondary | ICD-10-CM

## 2016-07-26 DIAGNOSIS — Z7982 Long term (current) use of aspirin: Secondary | ICD-10-CM

## 2016-07-26 DIAGNOSIS — N289 Disorder of kidney and ureter, unspecified: Secondary | ICD-10-CM | POA: Diagnosis not present

## 2016-07-26 DIAGNOSIS — K5791 Diverticulosis of intestine, part unspecified, without perforation or abscess with bleeding: Secondary | ICD-10-CM | POA: Diagnosis present

## 2016-07-26 DIAGNOSIS — E785 Hyperlipidemia, unspecified: Secondary | ICD-10-CM | POA: Diagnosis present

## 2016-07-26 DIAGNOSIS — Z1612 Extended spectrum beta lactamase (ESBL) resistance: Secondary | ICD-10-CM | POA: Diagnosis present

## 2016-07-26 DIAGNOSIS — N179 Acute kidney failure, unspecified: Secondary | ICD-10-CM | POA: Diagnosis not present

## 2016-07-26 DIAGNOSIS — R197 Diarrhea, unspecified: Secondary | ICD-10-CM

## 2016-07-26 DIAGNOSIS — R1110 Vomiting, unspecified: Secondary | ICD-10-CM

## 2016-07-26 DIAGNOSIS — I959 Hypotension, unspecified: Secondary | ICD-10-CM | POA: Diagnosis present

## 2016-07-26 DIAGNOSIS — R112 Nausea with vomiting, unspecified: Secondary | ICD-10-CM | POA: Diagnosis present

## 2016-07-26 DIAGNOSIS — F0390 Unspecified dementia without behavioral disturbance: Secondary | ICD-10-CM

## 2016-07-26 DIAGNOSIS — F0391 Unspecified dementia with behavioral disturbance: Secondary | ICD-10-CM | POA: Diagnosis present

## 2016-07-26 DIAGNOSIS — E039 Hypothyroidism, unspecified: Secondary | ICD-10-CM | POA: Diagnosis present

## 2016-07-26 DIAGNOSIS — F329 Major depressive disorder, single episode, unspecified: Secondary | ICD-10-CM | POA: Diagnosis present

## 2016-07-26 DIAGNOSIS — N3001 Acute cystitis with hematuria: Secondary | ICD-10-CM | POA: Diagnosis present

## 2016-07-26 DIAGNOSIS — E860 Dehydration: Secondary | ICD-10-CM | POA: Diagnosis present

## 2016-07-26 DIAGNOSIS — N390 Urinary tract infection, site not specified: Secondary | ICD-10-CM

## 2016-07-26 DIAGNOSIS — Z452 Encounter for adjustment and management of vascular access device: Secondary | ICD-10-CM

## 2016-07-26 DIAGNOSIS — Z823 Family history of stroke: Secondary | ICD-10-CM

## 2016-07-26 DIAGNOSIS — B9620 Unspecified Escherichia coli [E. coli] as the cause of diseases classified elsewhere: Secondary | ICD-10-CM | POA: Diagnosis present

## 2016-07-26 DIAGNOSIS — I10 Essential (primary) hypertension: Secondary | ICD-10-CM | POA: Diagnosis present

## 2016-07-26 DIAGNOSIS — Z66 Do not resuscitate: Secondary | ICD-10-CM | POA: Diagnosis present

## 2016-07-26 HISTORY — DX: Unspecified cataract: H26.9

## 2016-07-26 HISTORY — DX: Unspecified dementia, unspecified severity, without behavioral disturbance, psychotic disturbance, mood disturbance, and anxiety: F03.90

## 2016-07-26 HISTORY — DX: Hypothyroidism, unspecified: E03.9

## 2016-07-26 LAB — URINALYSIS COMPLETE WITH MICROSCOPIC (ARMC ONLY)
Bilirubin Urine: NEGATIVE
Glucose, UA: NEGATIVE mg/dL
Nitrite: NEGATIVE
PH: 6 (ref 5.0–8.0)
PROTEIN: 30 mg/dL — AB
SPECIFIC GRAVITY, URINE: 1.017 (ref 1.005–1.030)

## 2016-07-26 LAB — CBC
HCT: 37.5 % (ref 35.0–47.0)
HEMOGLOBIN: 12.7 g/dL (ref 12.0–16.0)
MCH: 32.6 pg (ref 26.0–34.0)
MCHC: 33.8 g/dL (ref 32.0–36.0)
MCV: 96.7 fL (ref 80.0–100.0)
Platelets: 230 10*3/uL (ref 150–440)
RBC: 3.88 MIL/uL (ref 3.80–5.20)
RDW: 14.6 % — ABNORMAL HIGH (ref 11.5–14.5)
WBC: 13 10*3/uL — ABNORMAL HIGH (ref 3.6–11.0)

## 2016-07-26 LAB — COMPREHENSIVE METABOLIC PANEL
ALBUMIN: 4.2 g/dL (ref 3.5–5.0)
ALT: 19 U/L (ref 14–54)
AST: 36 U/L (ref 15–41)
Alkaline Phosphatase: 94 U/L (ref 38–126)
Anion gap: 14 (ref 5–15)
BUN: 47 mg/dL — ABNORMAL HIGH (ref 6–20)
CHLORIDE: 104 mmol/L (ref 101–111)
CO2: 19 mmol/L — AB (ref 22–32)
CREATININE: 1.82 mg/dL — AB (ref 0.44–1.00)
Calcium: 9.6 mg/dL (ref 8.9–10.3)
GFR calc non Af Amer: 24 mL/min — ABNORMAL LOW (ref >60)
GFR, EST AFRICAN AMERICAN: 27 mL/min — AB (ref >60)
GLUCOSE: 172 mg/dL — AB (ref 65–99)
Potassium: 4.6 mmol/L (ref 3.5–5.1)
SODIUM: 137 mmol/L (ref 135–145)
Total Bilirubin: 0.6 mg/dL (ref 0.3–1.2)
Total Protein: 7.5 g/dL (ref 6.5–8.1)

## 2016-07-26 LAB — LIPASE, BLOOD: LIPASE: 44 U/L (ref 11–51)

## 2016-07-26 MED ORDER — CEFTRIAXONE SODIUM-DEXTROSE 1-3.74 GM-% IV SOLR
1.0000 g | Freq: Once | INTRAVENOUS | Status: AC
  Administered 2016-07-26: 1 g via INTRAVENOUS
  Filled 2016-07-26: qty 50

## 2016-07-26 MED ORDER — DEXTROSE 5 % IV SOLN: 1.0000 g | INTRAVENOUS | Status: DC

## 2016-07-26 MED ORDER — ONDANSETRON 4 MG PO TBDP: 4.0000 mg | ORAL_TABLET | Freq: Three times a day (TID) | ORAL | 0 refills | Status: DC | PRN

## 2016-07-26 MED ORDER — SODIUM CHLORIDE 0.9 % IV BOLUS (SEPSIS)
1000.0000 mL | Freq: Once | INTRAVENOUS | Status: AC
  Administered 2016-07-26: 1000 mL via INTRAVENOUS

## 2016-07-26 NOTE — ED Notes (Signed)
Alice Manning, Alice Manning, Alice Manning. [phone]\.   CALL WITH ANY UPDATES (OUT OF TOWN)

## 2016-07-26 NOTE — ED Provider Notes (Signed)
[facility] Emergency Department Provider Note  Time seen: 10:17 PM  I have reviewed the triage vital signs and the nursing notes.   HISTORY  Chief Complaint Emesis; Nausea; and Diarrhea    HPI Alice Manning is a 82 y.o. female with a past medical history of dementia, hypertension, hyperlipidemia who presents the emergency department from her assisted living facility for nausea, vomiting, diarrhea beginning around 3 PM tonight. Patient has significant dementia and cannot contribute to the history. When asked if she is vomiting or having diarrhea she states no.  Past Medical History:  Diagnosis Date  . Cataract   . Dementia   . Hyperlipemia   . Hypertension   . Hypothyroidism   . Mild cognitive impairment   . Thyroid disease     Patient Active Problem List   Diagnosis Date Noted  . Sepsis (HCC) 04/25/2016  . Pneumonia 04/25/2016    History reviewed. No pertinent surgical history.  Prior to Admission medications   Medication Sig Start Date End Date Taking? Authorizing Provider  acetaminophen (TYLENOL) 500 MG tablet Take 1,000 mg by mouth 3 (three) times daily.    Historical Provider, MD  aspirin 81 MG chewable tablet Take 81 mg by mouth daily.    Historical Provider, MD  Cranberry 450 MG CAPS Take 450 mg by mouth daily.     Historical Provider, MD  diltiazem (TIAZAC) 360 MG 24 hr capsule Take 360 mg by mouth daily.    Historical Provider, MD  escitalopram (LEXAPRO) 20 MG tablet Take 20 mg by mouth daily.    Historical Provider, MD  furosemide (LASIX) 40 MG tablet Take 20 mg by mouth daily.     Historical Provider, MD  guaiFENesin (MUCINEX) 600 MG 12 hr tablet Take 600 mg by mouth 2 (two) times daily.    Historical Provider, MD  guaiFENesin-codeine 100-10 MG/5ML syrup Take 10 mLs by mouth every 6 (six) hours as needed for cough. 04/30/16   Shaune PollackQing Chen, MD  levofloxacin (LEVAQUIN) 250 MG tablet Take 1 tablet (250 mg total) by mouth daily. Patient  not taking: Reported on 05/08/2016 04/30/16   Shaune PollackQing Chen, MD  levothyroxine (SYNTHROID, LEVOTHROID) 75 MCG tablet Take 75 mcg by mouth daily before breakfast.    Historical Provider, MD  lisinopril (PRINIVIL,ZESTRIL) 40 MG tablet Take 40 mg by mouth daily.    Historical Provider, MD  loperamide (IMODIUM) 2 MG capsule Take 2-4 mg by mouth as needed for diarrhea or loose stools. Take 4mg  after 1st loose stool then 2mg  after each subsequent loose stool (do not exceed 4 tablets in 24 hours) *if diarrhea persists longer than 24 hours call doctor*    Historical Provider, MD  memantine (NAMENDA) 10 MG tablet Take 10 mg by mouth 2 (two) times daily.    Historical Provider, MD  Menthol (ICY HOT) 5 % PTCH Apply 1 patch topically daily.    Historical Provider, MD  sulfamethoxazole-trimethoprim (BACTRIM DS,SEPTRA DS) 800-160 MG tablet Take 1 tablet by mouth every 12 (twelve) hours. Patient taking differently: Take 1 tablet by mouth every 12 (twelve) hours. For 10 days 04/30/16   Shaune PollackQing Chen, MD  vitamin B-12 (CYANOCOBALAMIN) 1000 MCG tablet Take 1,000 mcg by mouth daily.    Historical Provider, MD    No Known Allergies  Family History  Problem Relation Age of Onset  . Stroke Mother     Social History Social History  Substance Use Topics  . Smoking status: Never Smoker  . Smokeless tobacco:  Never Used  . Alcohol use No    Review of Systems Unable to obtain and review of systems due to significant dementia.  ____________________________________________   PHYSICAL EXAM:  VITAL SIGNS: ED Triage Vitals  Enc Vitals Group     BP 07/26/16 2152 98/76     Pulse Rate 07/26/16 2152 66     Resp 07/26/16 2152 18     Temp 07/26/16 2152 97.3 F (36.3 C)     Temp Source 07/26/16 2152 Oral     SpO2 07/26/16 2152 99 %     Weight 07/26/16 2154 110 lb (49.9 kg)     Height 07/26/16 2154 5\' 2"  (1.575 m)     Head Circumference --      Peak Flow --      Pain Score --      Pain Loc --      Pain Edu? --       Excl. in GC? --     Constitutional: Alert. Well appearing and in no distress.Patient becomes very agitated with any needle stick. Eyes: Normal exam ENT   Head: Normocephalic and atraumatic.   Mouth/Throat: Mucous membranes are moist. Cardiovascular: Normal rate, regular rhythm. No murmur Respiratory: Normal respiratory effort without tachypnea nor retractions. Breath sounds are clear  Gastrointestinal: Soft and nontender. No distention.   Musculoskeletal: Nontender with normal range of motion in all extremities. Neurologic:  Normal speech and language. No gross focal neurologic deficits  Skin:  Skin is warm, dry and intact.  ____________________________________________   INITIAL IMPRESSION / ASSESSMENT AND PLAN / ED COURSE  Pertinent labs & imaging results that were available during my care of the patient were reviewed by me and considered in my medical decision making (see chart for details).  The patient presents the emergency department nausea, vomiting, diarrhea beginning around 3 PM per EMS report. Patient appears well in the emergency department. Significant dementia and cannot contribute to her history been no distress. Is calm in bed, unless she experiences pain (needlestick, blood pressure cuff, etc.). Overall patient appears well, we'll IV hydrate, check labs and closely monitor. We'll dose Zofran in the emergency department. No bowel movements in the emergency department as of yet.  The patient appears well in the emergency department. He has had no episodes of diarrhea. No episodes of vomiting. Patient's lab work is largely within normal limits besides a mild leukocytosis. Nontender abdomen on exam. Creatinine is slightly elevated as well. We will dose IV fluids, obtain a urinalysis. As long as the urine is within normal limits anticipate likely discharge back to her facility was Zofran to be used as needed for nausea. Urinalysis is pending at this time, patient care signed  out to Dr. York CeriseForbach. ____________________________________________   FINAL CLINICAL IMPRESSION(S) / ED DIAGNOSES  Nausea vomiting diarrhea    Minna AntisKevin Madysun Thall, MD 07/26/16 2310

## 2016-07-26 NOTE — ED Notes (Signed)
Pt. Experienced 1 episode of diarrhea during in-and-out cath/cleaning/changing.

## 2016-07-26 NOTE — ED Provider Notes (Signed)
-----------------------------------------   11:17 PM on 07/26/2016 -----------------------------------------   Blood pressure 98/76, pulse 66, temperature 97.3 F (36.3 C), temperature source Oral, resp. rate 18, height 5\' 2"  (1.575 m), weight 49.9 kg, SpO2 99 %.  Assuming care from Dr. Lenard LancePaduchowski.  In short, Alice Manning is a 82 y.o. female with a chief complaint of Emesis; Nausea; and Diarrhea .  Refer to the original H&P for additional details.  The current plan of care is to follow up on UA and reassess after IV fluids.   ----------------------------------------- 12:17 AM on 07/27/2016 -----------------------------------------  Strongly positive UA, AKI (GFR down to about 24 from normally >60), +WBC, initially hypotensive.  Will admit for further management of UTI and renal insufficiency.   Loleta Roseory Hanh Kertesz, MD 07/27/16 [phone]

## 2016-07-26 NOTE — ED Triage Notes (Signed)
[facility] due to N/V/D since 3 pm today. Hx dementia at baseline, denies n/v/d

## 2016-07-26 NOTE — ED Notes (Signed)
Patient is resting comfortably at this time with no signs of distress present. VS stable. Will continue to monitor.   

## 2016-07-27 DIAGNOSIS — E860 Dehydration: Secondary | ICD-10-CM | POA: Diagnosis present

## 2016-07-27 DIAGNOSIS — F329 Major depressive disorder, single episode, unspecified: Secondary | ICD-10-CM | POA: Diagnosis present

## 2016-07-27 DIAGNOSIS — E785 Hyperlipidemia, unspecified: Secondary | ICD-10-CM | POA: Diagnosis present

## 2016-07-27 DIAGNOSIS — Z79899 Other long term (current) drug therapy: Secondary | ICD-10-CM | POA: Diagnosis not present

## 2016-07-27 DIAGNOSIS — N3001 Acute cystitis with hematuria: Secondary | ICD-10-CM | POA: Diagnosis present

## 2016-07-27 DIAGNOSIS — I959 Hypotension, unspecified: Secondary | ICD-10-CM | POA: Diagnosis present

## 2016-07-27 DIAGNOSIS — N289 Disorder of kidney and ureter, unspecified: Secondary | ICD-10-CM | POA: Diagnosis present

## 2016-07-27 DIAGNOSIS — Z66 Do not resuscitate: Secondary | ICD-10-CM | POA: Diagnosis present

## 2016-07-27 DIAGNOSIS — N179 Acute kidney failure, unspecified: Secondary | ICD-10-CM | POA: Diagnosis present

## 2016-07-27 DIAGNOSIS — Z1612 Extended spectrum beta lactamase (ESBL) resistance: Secondary | ICD-10-CM | POA: Diagnosis present

## 2016-07-27 DIAGNOSIS — B9620 Unspecified Escherichia coli [E. coli] as the cause of diseases classified elsewhere: Secondary | ICD-10-CM | POA: Diagnosis present

## 2016-07-27 DIAGNOSIS — Z823 Family history of stroke: Secondary | ICD-10-CM | POA: Diagnosis not present

## 2016-07-27 DIAGNOSIS — F0391 Unspecified dementia with behavioral disturbance: Secondary | ICD-10-CM | POA: Diagnosis present

## 2016-07-27 DIAGNOSIS — E039 Hypothyroidism, unspecified: Secondary | ICD-10-CM | POA: Diagnosis present

## 2016-07-27 DIAGNOSIS — Z7982 Long term (current) use of aspirin: Secondary | ICD-10-CM | POA: Diagnosis not present

## 2016-07-27 DIAGNOSIS — K5791 Diverticulosis of intestine, part unspecified, without perforation or abscess with bleeding: Secondary | ICD-10-CM | POA: Diagnosis present

## 2016-07-27 DIAGNOSIS — I10 Essential (primary) hypertension: Secondary | ICD-10-CM | POA: Diagnosis present

## 2016-07-27 DIAGNOSIS — R112 Nausea with vomiting, unspecified: Secondary | ICD-10-CM | POA: Diagnosis present

## 2016-07-27 LAB — MRSA PCR SCREENING: MRSA by PCR: POSITIVE — AB

## 2016-07-27 MED ORDER — CHLORHEXIDINE GLUCONATE CLOTH 2 % EX PADS
6.0000 | MEDICATED_PAD | Freq: Every day | CUTANEOUS | Status: DC
  Administered 2016-07-28 – 2016-07-30 (×2): 6 via TOPICAL

## 2016-07-27 MED ORDER — MEMANTINE HCL 5 MG PO TABS
10.0000 mg | ORAL_TABLET | Freq: Two times a day (BID) | ORAL | Status: DC
  Administered 2016-07-27 – 2016-07-31 (×7): 10 mg via ORAL
  Filled 2016-07-27 (×9): qty 2

## 2016-07-27 MED ORDER — MAGNESIUM CITRATE PO SOLN: 1.0000 | Freq: Once | ORAL | Status: DC | PRN

## 2016-07-27 MED ORDER — LISINOPRIL 20 MG PO TABS
40.0000 mg | ORAL_TABLET | Freq: Every day | ORAL | Status: DC
  Administered 2016-07-27: 40 mg via ORAL
  Filled 2016-07-27: qty 2

## 2016-07-27 MED ORDER — DILTIAZEM HCL ER COATED BEADS 120 MG PO CP24
360.0000 mg | ORAL_CAPSULE | Freq: Every day | ORAL | Status: DC
  Administered 2016-07-27 – 2016-07-29 (×2): 360 mg via ORAL
  Filled 2016-07-27 (×4): qty 3

## 2016-07-27 MED ORDER — ESCITALOPRAM OXALATE 10 MG PO TABS
20.0000 mg | ORAL_TABLET | Freq: Every day | ORAL | Status: DC
  Administered 2016-07-27 – 2016-07-31 (×5): 20 mg via ORAL
  Filled 2016-07-27 (×5): qty 2

## 2016-07-27 MED ORDER — ACETAMINOPHEN 325 MG PO TABS: 650.0000 mg | ORAL_TABLET | Freq: Four times a day (QID) | ORAL | Status: DC | PRN

## 2016-07-27 MED ORDER — ENOXAPARIN SODIUM 30 MG/0.3ML ~~LOC~~ SOLN: 30.0000 mg | Freq: Every day | SUBCUTANEOUS | Status: DC

## 2016-07-27 MED ORDER — ONDANSETRON HCL 4 MG PO TABS: 4.0000 mg | ORAL_TABLET | Freq: Four times a day (QID) | ORAL | Status: DC | PRN

## 2016-07-27 MED ORDER — HALOPERIDOL LACTATE 5 MG/ML IJ SOLN: 2.0000 mg | Freq: Four times a day (QID) | INTRAMUSCULAR | Status: DC | PRN

## 2016-07-27 MED ORDER — LEVOTHYROXINE SODIUM 75 MCG PO TABS
75.0000 ug | ORAL_TABLET | Freq: Every day | ORAL | Status: DC
  Administered 2016-07-27 – 2016-07-31 (×4): 75 ug via ORAL
  Filled 2016-07-27 (×4): qty 1

## 2016-07-27 MED ORDER — VITAMIN B-12 1000 MCG PO TABS
1000.0000 ug | ORAL_TABLET | Freq: Every day | ORAL | Status: DC
  Administered 2016-07-27 – 2016-07-31 (×4): 1000 ug via ORAL
  Filled 2016-07-27 (×4): qty 1

## 2016-07-27 MED ORDER — ASPIRIN 81 MG PO CHEW
81.0000 mg | CHEWABLE_TABLET | Freq: Every day | ORAL | Status: DC
  Administered 2016-07-27 – 2016-07-28 (×2): 81 mg via ORAL
  Filled 2016-07-27 (×3): qty 1

## 2016-07-27 MED ORDER — BISACODYL 5 MG PO TBEC
5.0000 mg | DELAYED_RELEASE_TABLET | Freq: Every day | ORAL | Status: DC | PRN
  Filled 2016-07-27: qty 1

## 2016-07-27 MED ORDER — SENNOSIDES-DOCUSATE SODIUM 8.6-50 MG PO TABS: 1.0000 | ORAL_TABLET | Freq: Every evening | ORAL | Status: DC | PRN

## 2016-07-27 MED ORDER — FUROSEMIDE 20 MG PO TABS
20.0000 mg | ORAL_TABLET | Freq: Every day | ORAL | Status: DC
  Administered 2016-07-27: 20 mg via ORAL
  Filled 2016-07-27: qty 1

## 2016-07-27 MED ORDER — SODIUM CHLORIDE 0.9 % IV SOLN
INTRAVENOUS | Status: DC
  Administered 2016-07-27 – 2016-07-28 (×2): via INTRAVENOUS

## 2016-07-27 MED ORDER — MUPIROCIN 2 % EX OINT
1.0000 "application " | TOPICAL_OINTMENT | Freq: Two times a day (BID) | CUTANEOUS | Status: DC
  Administered 2016-07-27 – 2016-07-31 (×8): 1 via NASAL
  Filled 2016-07-27: qty 22

## 2016-07-27 MED ORDER — ORAL CARE MOUTH RINSE
15.0000 mL | Freq: Two times a day (BID) | OROMUCOSAL | Status: DC
  Administered 2016-07-27 – 2016-07-31 (×5): 15 mL via OROMUCOSAL
  Filled 2016-07-27: qty 15

## 2016-07-27 MED ORDER — ONDANSETRON HCL 4 MG/2ML IJ SOLN: 4.0000 mg | Freq: Four times a day (QID) | INTRAMUSCULAR | Status: DC | PRN

## 2016-07-27 MED ORDER — HYDROCODONE-ACETAMINOPHEN 5-325 MG PO TABS: 1.0000 | ORAL_TABLET | ORAL | Status: DC | PRN

## 2016-07-27 MED ORDER — ZOLPIDEM TARTRATE 5 MG PO TABS: 5.0000 mg | ORAL_TABLET | Freq: Every evening | ORAL | Status: DC | PRN

## 2016-07-27 MED ORDER — HEPARIN SODIUM (PORCINE) 5000 UNIT/ML IJ SOLN
5000.0000 [IU] | Freq: Three times a day (TID) | INTRAMUSCULAR | Status: DC
  Administered 2016-07-27 – 2016-07-28 (×2): 5000 [IU] via SUBCUTANEOUS
  Filled 2016-07-27 (×3): qty 1

## 2016-07-27 MED ORDER — ACETAMINOPHEN 650 MG RE SUPP: 650.0000 mg | Freq: Four times a day (QID) | RECTAL | Status: DC | PRN

## 2016-07-27 MED ORDER — CEFTRIAXONE SODIUM-DEXTROSE 1-3.74 GM-% IV SOLR
1.0000 g | INTRAVENOUS | Status: DC
  Administered 2016-07-27 – 2016-07-28 (×2): 1 g via INTRAVENOUS
  Filled 2016-07-27 (×3): qty 50

## 2016-07-27 NOTE — Progress Notes (Signed)
On assessment patient agitated. Patient refusing the nurse tech to feed her breakfast. Patient lung and heart sounds within normal limits. Patient has scattered bruising on skin but unable to tell where they came from. In report they were present on admission. Patient only alert and oriented to self. Patient was able to swallow meds crushed in apple sauce. No strangulation with thin liquids.   Harvie HeckMelanie Ayeza Therriault, RN

## 2016-07-27 NOTE — Progress Notes (Signed)
Order for enoxaparin 30 mg subcutaneously for DVT prophylaxis changed to heparin 5000 units subcutaneously q8h per anticoagulation protocol for CrCl </= 15 mL/min.  Cindi CarbonMary M Kathrynne Kulinski, PharmD, BCPS Clinical Pharmacist 07/27/16 11:26 AM

## 2016-07-27 NOTE — Progress Notes (Signed)
[facility] [place] [facility]   PATIENT NAME: Alice SpannerGeraldine Manning    MR#:  [id number]  DATE OF BIRTH:  04/25/1926  SUBJECTIVE:   Patient here due to acute renal failure. She has severe underlying dementia and therefore a poor historian. No other acute events overnight.  REVIEW OF SYSTEMS:    Review of Systems  Unable to perform ROS: Dementia    Nutrition: Heart healthy Tolerating Diet: Yes but little Tolerating PT: Await Eval.    DRUG ALLERGIES:  No Known Allergies  VITALS:  Blood pressure 123/80, pulse 87, temperature 98.2 F (36.8 C), temperature source Axillary, resp. rate 19, height 5\' 2"  (1.575 m), weight 46.3 kg (102 lb), SpO2 100 %.  PHYSICAL EXAMINATION:   Physical Exam  GENERAL:  82 y.o.-year-old patient lying in the bed with in acute distress.  EYES: Pupils equal, round, reactive to light and accommodation. No scleral icterus. Extraocular muscles intact.  HEENT: Head atraumatic, normocephalic. Oropharynx and nasopharynx clear.  NECK:  Supple, no jugular venous distention. No thyroid enlargement, no tenderness.  LUNGS: Normal breath sounds bilaterally, no wheezing, rales, rhonchi. No use of accessory muscles of respiration.  CARDIOVASCULAR: S1, S2 normal. No murmurs, rubs, or gallops.  ABDOMEN: Soft, nontender, nondistended. Bowel sounds present. No organomegaly or mass.  EXTREMITIES: No cyanosis, clubbing or edema b/l.    NEUROLOGIC: Cranial nerves II through XII are intact. No focal Motor or sensory deficits b/l. Globally weak.   PSYCHIATRIC: The patient is alert and oriented x 1.  SKIN: No obvious rash, lesion, or ulcer.    LABORATORY PANEL:   CBC  Recent Labs Lab 07/26/16 2202  WBC 13.0*  HGB 12.7  HCT 37.5  PLT 230   ------------------------------------------------------------------------------------------------------------------  Chemistries   Recent Labs Lab 07/26/16 2202  NA 137  K 4.6  CL 104  CO2 19*  GLUCOSE  172*  BUN 47*  CREATININE 1.82*  CALCIUM 9.6  AST 36  ALT 19  ALKPHOS 94  BILITOT 0.6   ------------------------------------------------------------------------------------------------------------------  Cardiac Enzymes No results for input(s): TROPONINI in the last 168 hours. ------------------------------------------------------------------------------------------------------------------  RADIOLOGY:  No results found.   ASSESSMENT AND PLAN:   82 year old female with history of advanced dementia, hypertension, hyperlipidemia, hypothyroidism, to the hospital due to altered mental status and noted to have urinary tract infection noted to be in acute kidney injury.  1. Acute kidney injury-secondary to dehydration. -Continue hydration with IV fluids, follow BUN and creatinine and urine output. Hold Lasix, lisinopril.  2. Urinary tract infection-continue IV ceftriaxone, follow urine cultures.  3. Depression-continue Lexapro.  4. Dementia-continue Namenda.  5. Hypothyroidism-continue Synthroid.   All the records are reviewed and case discussed with Care Management/Social Worker. Management plans discussed with the patient, family and they are in agreement.  CODE STATUS: Full  DVT Prophylaxis: Heparin subcutaneous  TOTAL TIME TAKING CARE OF THIS PATIENT: 30 minutes.   POSSIBLE D/C IN 1-2 DAYS, DEPENDING ON CLINICAL CONDITION.   Houston SirenSAINANI,VIVEK J M.D on 07/27/2016 at 12:05 PM  Between 7am to 6pm - Pager - [phone]  After 6pm go to [email or web address] - Scientist, research (life [email or web address])password [email or web address] [facility]  [facility] [place] [facility]  Office  [phone]  CC: Primary care physician; Randie HeinzAnne Marie Mukamana, NP

## 2016-07-27 NOTE — Progress Notes (Signed)
Pharmacy Antibiotic Note  Alice Manning is a 82 y.o. female admitted on 07/26/2016 with UTI.  Pharmacy has been consulted for ceftriaxone dosing.  Plan: Ceftriaxone 1 gm IV Q24H  Height: 5\' 2"  (157.5 cm) Weight: 110 lb (49.9 kg) IBW/kg (Calculated) : 50.1  Temp (24hrs), Avg:97.3 F (36.3 C), Min:97.3 F (36.3 C), Max:97.3 F (36.3 C)   Recent Labs Lab 07/26/16 2202  WBC 13.0*  CREATININE 1.82*    Estimated Creatinine Clearance: 16.8 mL/min (by C-G formula based on SCr of 1.82 mg/dL (H)).    No Known Allergies  Thank you for allowing pharmacy to be a part of this patient's care.  Carola FrostNathan A Ivar Domangue, Pharm.D., BCPS Clinical Pharmacist 07/27/2016 1:46 AM

## 2016-07-27 NOTE — Progress Notes (Signed)
Spoke with daughter in law over the phone. Patient family members is out of town for the holidays. Son, Rosalia HammersRay, is POA and will bring copy of legal papers tonight when he comes back in town. Family members express that patient has been wheelchair bound for a few months because [facility]. Patient family also stated that [facility] and since then she needs assistance with eating. RN informed family that patient is refusing to eat.   Harvie HeckMelanie Roland Prine, RN

## 2016-07-27 NOTE — H&P (Signed)
[facility] - [place] [facility] Admission History and Physical Alice RoyaltyAlexis Novia Manning, D.O.  ---------------------------------------------------------------------------------------------------------------------   PATIENT NAME: Alice SpannerGeraldine Manning MR#: [id number] DATE OF BIRTH: 10/03/1925 DATE OF ADMISSION: 07/26/2016 PRIMARY CARE PHYSICIAN: Randie HeinzAnne Marie Mukamana, NP  REQUESTING/REFERRING PHYSICIAN: ED Dr. York CeriseForbach  CHIEF COMPLAINT: Chief Complaint  Patient presents with  . Emesis  . Nausea  . Diarrhea    HISTORY OF PRESENT ILLNESS:Please note that the patient's history [facility] and ED physician. Patient offers no history secondary to dementia. Alice Manning is a 82 y.o. female with a known history of Dementia, hypertension, hyperlipidemia presents to the emergency department for evaluation of nausea vomiting and diarrhea.  Patient lives at an ALF and there is no documentation from them.    EMS/ED COURSE:   Patient received Rocephin.  PAST MEDICAL HISTORY: Past Medical History:  Diagnosis Date  . Cataract   . Dementia   . Hyperlipemia   . Hypertension   . Hypothyroidism   . Mild cognitive impairment   . Thyroid disease     PAST SURGICAL HISTORY: History reviewed. No pertinent surgical history.    SOCIAL HISTORY: Social History  Substance Use Topics  . Smoking status: Never Smoker  . Smokeless tobacco: Never Used  . Alcohol use No    FAMILY HISTORY: Family History  Problem Relation Age of Onset  . Stroke Mother     MEDICATIONS AT HOME: Prior to Admission medications   Medication Sig Start Date End Date Taking? Authorizing Provider  acetaminophen (TYLENOL) 500 MG tablet Take 1,000 mg by mouth 3 (three) times daily.   Yes Historical Provider, MD  aspirin 81 MG chewable tablet Take 81 mg by mouth daily.   Yes Historical Provider, MD  Cranberry 450 MG CAPS Take 450 mg by mouth daily.    Yes Historical Provider, MD   diltiazem (TIAZAC) 360 MG 24 hr capsule Take 360 mg by mouth daily.   Yes Historical Provider, MD  escitalopram (LEXAPRO) 20 MG tablet Take 20 mg by mouth daily.   Yes Historical Provider, MD  furosemide (LASIX) 40 MG tablet Take 20 mg by mouth daily.    Yes Historical Provider, MD  levothyroxine (SYNTHROID, LEVOTHROID) 75 MCG tablet Take 75 mcg by mouth daily before breakfast.   Yes Historical Provider, MD  lisinopril (PRINIVIL,ZESTRIL) 40 MG tablet Take 40 mg by mouth daily.   Yes Historical Provider, MD  memantine (NAMENDA) 10 MG tablet Take 10 mg by mouth 2 (two) times daily.   Yes Historical Provider, MD  Menthol (ICY HOT) 5 % PTCH Apply 1 patch topically daily.   Yes Historical Provider, MD  vitamin B-12 (CYANOCOBALAMIN) 1000 MCG tablet Take 1,000 mcg by mouth daily.   Yes Historical Provider, MD  ondansetron (ZOFRAN ODT) 4 MG disintegrating tablet Take 1 tablet (4 mg total) by mouth every 8 (eight) hours as needed for nausea or vomiting. 07/26/16   Minna AntisKevin Paduchowski, MD     DRUG ALLERGIES: No Known Allergies   REVIEW OF SYSTEMS: Unable to obtain secondary to the patient's dementia  PHYSICAL EXAMINATION: VITAL SIGNS: Blood pressure (!) 127/50, pulse 68, temperature 97.3 F (36.3 C), temperature source Oral, resp. rate 20, height 5\' 2"  (1.575 m), weight 49.9 kg (110 lb), SpO2 100 %.  GENERAL: 82 y.o.-year-old pale white female patient, well-developed, well-nourished lying in the bed in no acute distress.  Pleasantly demented.   HEENT: Head atraumatic, normocephalic. Pupils equal, round, reactive to light and accommodation. No  scleral icterus. Extraocular muscles intact. Oropharynx is clear. Mucus membranes dry.  NECK: Supple, full range of motion. No JVD, no bruit heard. No cervical lymphadenopathy. CHEST: Normal breath sounds bilaterally. No wheezing, rales, rhonchi or crackles. No use of accessory muscles of respiration.  No reproducible chest wall tenderness.  CARDIOVASCULAR: S1,  S2 normal. No murmurs, rubs, or gallops appreciated. Cap refill <2 seconds. ABDOMEN: Soft, nontender, nondistended. No rebound, guarding, rigidity. Normoactive bowel sounds present in all four quadrants. No organomegaly or mass. EXTREMITIES: Full range of motion. No pedal edema, cyanosis, or clubbing. NEUROLOGIC: Follows some commands.  PSYCHIATRIC: The patient is alert and oriented x 1.  SKIN: Warm, dry, and intact without obvious rash, lesion, or ulcer.  LABORATORY PANEL:  CBC  Recent Labs Lab 07/26/16 2202  WBC 13.0*  HGB 12.7  HCT 37.5  PLT 230   ----------------------------------------------------------------------------------------------------------------- Chemistries  Recent Labs Lab 07/26/16 2202  NA 137  K 4.6  CL 104  CO2 19*  GLUCOSE 172*  BUN 47*  CREATININE 1.82*  CALCIUM 9.6  AST 36  ALT 19  ALKPHOS 94  BILITOT 0.6   ------------------------------------------------------------------------------------------------------------------ Cardiac Enzymes No results for input(s): TROPONINI in the last 168 hours. ------------------------------------------------------------------------------------------------------------------   IMPRESSION AND PLAN:  This is a 82 y.o. female with a history of dementia, hypertension, hyperlipidemia now being admitted with: 1. Acute kidney injury likely secondary to dehydration-we'll admit for IV fluid hydration and recheck BMP in the a.m. nausea and vomiting have resolved in the emergency department and patient has a benign abdomen therefore we'll continue antiemetics as needed and monitor.  2. Urinary tract infection-continue IV Rocephin and follow up urine cultures.  3. History of dementia-continue Namenda and Lexapro 4. History of hypertension-continue diltiazem, Lasix, lisinopril 4. History of hypothyroidism-continue Synthroid Continue aspirin, B12.   Diet/Nutrition: Heart healthy Fluids: IV normal saline DVT Px: Lovenox,  SCDs and early ambulation Code Status: Full  All the records are reviewed and case discussed with ED provider. Management plans discussed with the patient and/or family who express understanding and agree with plan of care.   TOTAL TIME TAKING CARE OF THIS PATIENT: 60 minutes.   Lynix Bonine D.O. on 07/27/2016 at 1:06 AM Between 7am to 6pm - Pager - [phone] After 6pm go to [email or web address] - [email or web address] [email or web address] [facility] [place] [facility] Office [phone] CC: Primary care physician; Randie HeinzAnne Marie Mukamana, NP     Note: This dictation was prepared with Dragon dictation along with smaller phrase technology. Any transcriptional errors that result from this process are unintentional.

## 2016-07-27 NOTE — ED Notes (Signed)
Patient is resting comfortably at this time with no signs of distress present. VS stable. Will continue to monitor.   

## 2016-07-28 LAB — HEMOGLOBIN AND HEMATOCRIT, BLOOD
HEMATOCRIT: 30.2 % — AB (ref 35.0–47.0)
Hemoglobin: 10.3 g/dL — ABNORMAL LOW (ref 12.0–16.0)

## 2016-07-28 LAB — BASIC METABOLIC PANEL
ANION GAP: 4 — AB (ref 5–15)
BUN: 23 mg/dL — ABNORMAL HIGH (ref 6–20)
CALCIUM: 8.5 mg/dL — AB (ref 8.9–10.3)
CO2: 22 mmol/L (ref 22–32)
CREATININE: 1.04 mg/dL — AB (ref 0.44–1.00)
Chloride: 115 mmol/L — ABNORMAL HIGH (ref 101–111)
GFR, EST AFRICAN AMERICAN: 54 mL/min — AB (ref >60)
GFR, EST NON AFRICAN AMERICAN: 47 mL/min — AB (ref >60)
Glucose, Bld: 79 mg/dL (ref 65–99)
Potassium: 3.6 mmol/L (ref 3.5–5.1)
SODIUM: 141 mmol/L (ref 135–145)

## 2016-07-28 LAB — OCCULT BLOOD X 1 CARD TO LAB, STOOL: FECAL OCCULT BLD: POSITIVE — AB

## 2016-07-28 MED ORDER — PANTOPRAZOLE SODIUM 40 MG PO TBEC
40.0000 mg | DELAYED_RELEASE_TABLET | Freq: Two times a day (BID) | ORAL | Status: DC
  Administered 2016-07-29 (×2): 40 mg via ORAL
  Filled 2016-07-28 (×4): qty 1

## 2016-07-28 NOTE — Clinical Social Work Note (Signed)
Clinical Social Work Assessment  Patient Details  Name: Alice DavidGeraldine K Brazeau MRN: [id number] Date of Birth: 07/24/1926  Date of referral:  07/28/16               Reason for consult:  Facility Placement                Permission sought to share information with:    Permission granted to share information::     Name::        Agency::     Relationship::     Contact Information:     Housing/Transportation Living arrangements for the past 2 months:  Assisted Living Facility Source of Information:  Facility Patient Interpreter Needed:  None Criminal Activity/Legal Involvement Pertinent to Current Situation/Hospitalization:  No - Comment as needed Significant Relationships:  Adult Children Lives with:  Facility Resident Do you feel safe going back to the place where you live?  Yes Need for family participation in patient care:  Yes (Comment)  Care giving concerns:  [facility]   Social Worker assessment / plan:  CSW attempted to contact patient's family and left message. CSW contacted patient's facility. Patient is baseline agitated at times due to s/s of dementia. According to the facility, the patient can return when stable; however, Tiffany deferred to the Therapist, artWellness director for final approval. CSW will advise primary CSW via handoff.   Employment status:  Retired Health and safety inspectornsurance information:  Medicare PT Recommendations:  Not assessed at this time Information / Referral to community resources:     Patient/Family's Response to care:  Patient has dementia.  Patient/Family's Understanding of and Emotional Response to Diagnosis, Current Treatment, and Prognosis:  Patient has dementia.  Emotional Assessment Appearance:  Appears stated age Attitude/Demeanor/Rapport:   (Patient has dementia, baseline agitated.) Affect (typically observed):   (Patient has dementia, baseline agitated.) Orientation:   (Patient has dementia, baseline agitated.) Alcohol / Substance use:   Never Used Psych involvement (Current and /or in the community):  No (Comment)  Discharge Needs  Concerns to be addressed:  Discharge Planning Concerns Readmission within the last 30 days:  No Current discharge risk:  None Barriers to Discharge:  Continued Medical Work up   [employer] CorporationKaren M Tollie Canada, LCSW 07/28/2016, 2:02 PM

## 2016-07-28 NOTE — Progress Notes (Signed)
RN called and reported large amount of blood in patient's diaper. Patient is demented and poor historian Assessment and plan Stool with blood: Discontinued heparin subcutaneous Check stool for Hemoccult Hemoglobin and hematocrit every 6 hours Nothing by mouth after midnight and GI consult and Protonix

## 2016-07-28 NOTE — Progress Notes (Signed)
[facility] - [place] [facility]   PATIENT NAME: Alice SpannerGeraldine Gamm    MR#:  [id number]  DATE OF BIRTH:  08/06/1926  SUBJECTIVE:   No acute events overnight. Renal function improved.    REVIEW OF SYSTEMS:    Review of Systems  Unable to perform ROS: Dementia    Nutrition: Heart healthy Tolerating Diet: Yes but little Tolerating PT: Await Eval.    DRUG ALLERGIES:  No Known Allergies  VITALS:  Blood pressure 137/62, pulse 76, temperature 98.1 F (36.7 C), temperature source Oral, resp. rate 18, height 5\' 2"  (1.575 m), weight 46.3 kg (102 lb), SpO2 96 %.  PHYSICAL EXAMINATION:   Physical Exam  GENERAL:  82 y.o.-year-old patient lying in the bed with in acute distress.  EYES: Pupils equal, round, reactive to light and accommodation. No scleral icterus. Extraocular muscles intact.  HEENT: Head atraumatic, normocephalic. Oropharynx and nasopharynx clear. Moist oral mucosa.  NECK:  Supple, no jugular venous distention. No thyroid enlargement, no tenderness.  LUNGS: Normal breath sounds bilaterally, no wheezing, rales, rhonchi. No use of accessory muscles of respiration.  CARDIOVASCULAR: S1, S2 normal. No murmurs, rubs, or gallops.  ABDOMEN: Soft, nontender, nondistended. Bowel sounds present. No organomegaly or mass.  EXTREMITIES: No cyanosis, clubbing or edema b/l.    NEUROLOGIC: Cranial nerves II through XII are intact. No focal Motor or sensory deficits b/l. Globally weak.   PSYCHIATRIC: The patient is alert and oriented x 1.  SKIN: No obvious rash, lesion, or ulcer.    LABORATORY PANEL:   CBC  Recent Labs Lab 07/26/16 2202  WBC 13.0*  HGB 12.7  HCT 37.5  PLT 230   ------------------------------------------------------------------------------------------------------------------  Chemistries   Recent Labs Lab 07/26/16 2202 07/28/16 0423  NA 137 141  K 4.6 3.6  CL 104 115*  CO2 19* 22  GLUCOSE 172* 79  BUN 47* 23*  CREATININE 1.82*  1.04*  CALCIUM 9.6 8.5*  AST 36  --   ALT 19  --   ALKPHOS 94  --   BILITOT 0.6  --    ------------------------------------------------------------------------------------------------------------------  Cardiac Enzymes No results for input(s): TROPONINI in the last 168 hours. ------------------------------------------------------------------------------------------------------------------  RADIOLOGY:  No results found.   ASSESSMENT AND PLAN:   82 year old female with history of advanced dementia, hypertension, hyperlipidemia, hypothyroidism, to the hospital due to altered mental status and noted to have urinary tract infection noted to be in acute kidney injury.  1. Acute kidney injury-secondary to dehydration. - improved w/ IV fluid hydration.  Resume Lasix, Lisinopril in a.m. Tomorrow.   2. Urinary tract infection-continue IV ceftriaxone - urine cultures + for 100,000 col of gram (-) rod.    3. Depression-continue Lexapro.  4. Dementia-continue Namenda.  5. Hypothyroidism-continue Synthroid.  Possible d/c back to Assisted Living tomorrow.   All the records are reviewed and case discussed with Care Management/Social Worker. Management plans discussed with the patient, family and they are in agreement.  CODE STATUS: Full  DVT Prophylaxis: Heparin subcutaneous  TOTAL TIME TAKING CARE OF THIS PATIENT: 25 minutes.   POSSIBLE D/C IN 1-2 DAYS, DEPENDING ON CLINICAL CONDITION.   Houston SirenSAINANI,Karmela Bram J M.D on 07/28/2016 at 12:41 PM  Between 7am to 6pm - Pager - [phone]  After 6pm go to [email or web address] - Scientist, research (life sciences)password [email or web address]  [facility] [place] [facility]  Office  [phone]  CC: Primary care physician; Randie HeinzAnne Marie Mukamana, NP

## 2016-07-28 NOTE — [place] FL2 (Signed)
[place] MEDICAID FL2 LEVEL OF CARE SCREENING TOOL     IDENTIFICATION  Patient Name: Alice DavidGeraldine K Cariker Birthdate: 08/20/1926 Sex: female Admission Date (Current Location): 07/26/2016  [place]   Facility and Address:  [facility], [address], [place], [place] [address]      Provider Number: [id number]  Attending Physician Name and Address:  Houston SirenVivek J Sainani, MD  Relative Name and Phone Number:       Current Level of Care: Hospital Recommended Level of Care: SNF Prior Approval Number:    Date Approved/Denied:   PASRR Number:    Discharge Plan: SNF    Current Diagnoses: Patient Active Problem List   Diagnosis Date Noted  . Acute kidney injury (HCC) 07/27/2016  . Sepsis (HCC) 04/25/2016  . Pneumonia 04/25/2016    Orientation RESPIRATION BLADDER Height & Weight     Self  Normal Incontinent Weight: 102 lb (46.3 kg) Height:  5\' 2"  (157.5 cm)  BEHAVIORAL SYMPTOMS/MOOD NEUROLOGICAL BOWEL NUTRITION STATUS      Incontinent    AMBULATORY STATUS COMMUNICATION OF NEEDS Skin   Total Care Verbally Normal                       Personal Care Assistance Level of Assistance  Bathing, Feeding, Dressing, Total care Bathing Assistance: Maximum assistance Feeding assistance: Limited assistance Dressing Assistance: Maximum assistance Total Care Assistance: Limited assistance   Functional Limitations Info             SPECIAL CARE FACTORS FREQUENCY   Patient needs SNF for IV ABX: IV Invanz for ESBL in urine and in contact isolation                    Contractures Contractures Info: Present    Additional Factors Info  Code Status Code Status Info: DNR             Current Medications (07/28/2016):  This is the current hospital active medication list Current Facility-Administered Medications  Medication Dose Route Frequency Provider Last Rate Last Dose  . 0.9 %  sodium chloride infusion    Intravenous Continuous Alexis Hugelmeyer, DO 75 mL/hr at 07/28/16 0808    . acetaminophen (TYLENOL) tablet 650 mg  650 mg Oral Q6H PRN Alexis Hugelmeyer, DO       Or  . acetaminophen (TYLENOL) suppository 650 mg  650 mg Rectal Q6H PRN Alexis Hugelmeyer, DO      . aspirin chewable tablet 81 mg  81 mg Oral Daily Alexis Hugelmeyer, DO   81 mg at 07/28/16 0801  . bisacodyl (DULCOLAX) EC tablet 5 mg  5 mg Oral Daily PRN Alexis Hugelmeyer, DO      . cefTRIAXone (ROCEPHIN) IVPB 1 g  1 g Intravenous Q24H Alexis Hugelmeyer, DO   1 g at 07/27/16 2120  . Chlorhexidine Gluconate Cloth 2 % PADS 6 each  6 each Topical Q0600 Alexis Hugelmeyer, DO   6 each at 07/28/16 [phone]  . diltiazem (CARDIZEM CD) 24 hr capsule 360 mg  360 mg Oral Daily Alexis Hugelmeyer, DO   360 mg at 07/27/16 0905  . escitalopram (LEXAPRO) tablet 20 mg  20 mg Oral Daily Alexis Hugelmeyer, DO   20 mg at 07/28/16 0802  . haloperidol lactate (HALDOL) injection 2 mg  2 mg Intravenous Q6H PRN Houston SirenVivek J Sainani, MD      . heparin injection 5,000 Units  5,000 Units Subcutaneous [address] Mary M MosheimSwayne,  RPH   5,000 Units at 07/27/16 1357  . HYDROcodone-acetaminophen (NORCO/VICODIN) 5-325 MG per tablet 1-2 tablet  1-2 tablet Oral Q4H PRN Alexis Hugelmeyer, DO      . levothyroxine (SYNTHROID, LEVOTHROID) tablet 75 mcg  75 mcg Oral QAC breakfast Alexis Hugelmeyer, DO   75 mcg at 07/28/16 0801  . magnesium citrate solution 1 Bottle  1 Bottle Oral Once PRN Alexis Hugelmeyer, DO      . MEDLINE mouth rinse  15 mL Mouth Rinse BID Houston SirenVivek J Sainani, MD   15 mL at 07/27/16 1500  . memantine (NAMENDA) tablet 10 mg  10 mg Oral BID Alexis Hugelmeyer, DO   10 mg at 07/28/16 0800  . mupirocin ointment (BACTROBAN) 2 % 1 application  1 application Nasal BID Alexis Hugelmeyer, DO   1 application at 07/28/16 0801  . ondansetron (ZOFRAN) tablet 4 mg  4 mg Oral Q6H PRN Alexis Hugelmeyer, DO       Or  . ondansetron (ZOFRAN) injection 4 mg  4 mg Intravenous Q6H PRN Alexis  Hugelmeyer, DO      . senna-docusate (Senokot-S) tablet 1 tablet  1 tablet Oral QHS PRN Alexis Hugelmeyer, DO      . vitamin B-12 (CYANOCOBALAMIN) tablet 1,000 mcg  1,000 mcg Oral Daily Alexis Hugelmeyer, DO   1,000 mcg at 07/27/16 0904  . zolpidem (AMBIEN) tablet 5 mg  5 mg Oral QHS PRN Alexis Hugelmeyer, DO         Discharge Medications: Please see discharge summary for a list of discharge medications.  Relevant Imaging Results:  Relevant Lab Results:   Additional Information    Judi CongKaren M White, LCSW

## 2016-07-29 ENCOUNTER — Inpatient Hospital Stay: Payer: Medicare Other

## 2016-07-29 LAB — HEMOGLOBIN AND HEMATOCRIT, BLOOD
HEMATOCRIT: 30.1 % — AB (ref 35.0–47.0)
Hemoglobin: 10.2 g/dL — ABNORMAL LOW (ref 12.0–16.0)

## 2016-07-29 LAB — CBC
HEMATOCRIT: 29.9 % — AB (ref 35.0–47.0)
Hemoglobin: 10.1 g/dL — ABNORMAL LOW (ref 12.0–16.0)
MCH: 32 pg (ref 26.0–34.0)
MCHC: 33.7 g/dL (ref 32.0–36.0)
MCV: 95 fL (ref 80.0–100.0)
Platelets: 153 10*3/uL (ref 150–440)
RBC: 3.15 MIL/uL — AB (ref 3.80–5.20)
RDW: 13.8 % (ref 11.5–14.5)
WBC: 6.7 10*3/uL (ref 3.6–11.0)

## 2016-07-29 LAB — URINE CULTURE: Special Requests: NORMAL

## 2016-07-29 LAB — BASIC METABOLIC PANEL
Anion gap: 7 (ref 5–15)
BUN: 18 mg/dL (ref 6–20)
CHLORIDE: 114 mmol/L — AB (ref 101–111)
CO2: 20 mmol/L — AB (ref 22–32)
CREATININE: 0.91 mg/dL (ref 0.44–1.00)
Calcium: 8.5 mg/dL — ABNORMAL LOW (ref 8.9–10.3)
GFR calc Af Amer: >60 mL/min (ref >60)
GFR calc non Af Amer: 55 mL/min — ABNORMAL LOW (ref >60)
Glucose, Bld: 65 mg/dL (ref 65–99)
POTASSIUM: 3.4 mmol/L — AB (ref 3.5–5.1)
Sodium: 141 mmol/L (ref 135–145)

## 2016-07-29 MED ORDER — SODIUM CHLORIDE 0.9 % IV SOLN
1.0000 g | INTRAVENOUS | Status: DC
  Administered 2016-07-29 – 2016-07-31 (×3): 1 g via INTRAVENOUS
  Filled 2016-07-29 (×3): qty 1

## 2016-07-29 NOTE — Progress Notes (Signed)
Patient ID: Dionisio DavidGeraldine K Priebe, female   DOB: 09/01/1926, 82 y.o.   MRN: [id number]  [employer] [facility] PROGRESS NOTE  Dionisio DavidGeraldine K Laconte WUJ:[id number] DOB: 07/17/1926 DOA: 07/26/2016 PCP: Randie HeinzAnne Marie Mukamana, NP  HPI/Subjective: Patient answers no to all questions. States she feels okay. Limited historian secondary to dementia  Objective: Vitals:   07/29/16 0407 07/29/16 1426  BP: (!) 180/65 (!) 174/69  Pulse: 92 95  Resp: 18 18  Temp: 98 F (36.7 C)     Filed Weights   07/26/16 2154 07/27/16 0200  Weight: 49.9 kg (110 lb) 46.3 kg (102 lb)    ROS: Review of Systems  Unable to perform ROS: Dementia  Respiratory: Negative for shortness of breath.   Cardiovascular: Negative for chest pain.  Gastrointestinal: Negative for abdominal pain, diarrhea and nausea.  Genitourinary: Negative for dysuria.  Musculoskeletal: Negative for myalgias.   Exam: Physical Exam  HENT:  Nose: No mucosal edema.  Mouth/Throat: No oropharyngeal exudate or posterior oropharyngeal edema.  Eyes: Conjunctivae, EOM and lids are normal. Pupils are equal, round, and reactive to light.  Neck: No JVD present. Carotid bruit is not present. No edema present. No thyroid mass and no thyromegaly present.  Cardiovascular: S1 normal and S2 normal.  Exam reveals no gallop.   Murmur heard.  Systolic murmur is present with a grade of 2/6  Pulses:      Dorsalis pedis pulses are 2+ on the right side, and 2+ on the left side.  Respiratory: No respiratory distress. She has no wheezes. She has no rhonchi. She has no rales.  GI: Soft. Bowel sounds are normal. There is no tenderness.  Musculoskeletal:       Right ankle: She exhibits no swelling.       Left ankle: She exhibits no swelling.  Lymphadenopathy:    She has no cervical adenopathy.  Neurological: She is alert.  Skin: Skin is warm. No rash noted. Nails show no clubbing.  Psychiatric: She has a normal mood and affect.      Data Reviewed: Basic Metabolic  Panel:  Recent Labs Lab 07/26/16 2202 07/28/16 0423 07/29/16 0552  NA 137 141 141  K 4.6 3.6 3.4*  CL 104 115* 114*  CO2 19* 22 20*  GLUCOSE 172* 79 65  BUN 47* 23* 18  CREATININE 1.82* 1.04* 0.91  CALCIUM 9.6 8.5* 8.5*   Liver Function Tests:  Recent Labs Lab 07/26/16 2202  AST 36  ALT 19  ALKPHOS 94  BILITOT 0.6  PROT 7.5  ALBUMIN 4.2    Recent Labs Lab 07/26/16 2202  LIPASE 44   CBC:  Recent Labs Lab 07/26/16 2202 07/28/16 1903 07/29/16 0006 07/29/16 0552  WBC 13.0*  --   --  6.7  HGB 12.7 10.3* 10.2* 10.1*  HCT 37.5 30.2* 30.1* 29.9*  MCV 96.7  --   --  95.0  PLT 230  --   --  153     Recent Results (from the past 240 hour(s))  Urine culture     Status: Abnormal   Collection Time: 07/26/16 11:32 PM  Result Value Ref Range Status   Specimen Description URINE, RANDOM  Final   Special Requests Normal  Final   Culture >=100,000 COLONIES/mL ESCHERICHIA COLI (A)  Final   Report Status 07/29/2016 FINAL  Final   Organism ID, Bacteria ESCHERICHIA COLI (A)  Final      Susceptibility   Escherichia coli - MIC*    AMPICILLIN >=32 RESISTANT Resistant  CEFAZOLIN >=64 RESISTANT Resistant     CEFTRIAXONE >=64 RESISTANT Resistant     CIPROFLOXACIN >=4 RESISTANT Resistant     GENTAMICIN <=1 SENSITIVE Sensitive     IMIPENEM <=0.25 SENSITIVE Sensitive     NITROFURANTOIN <=16 SENSITIVE Sensitive     TRIMETH/SULFA <=20 SENSITIVE Sensitive     AMPICILLIN/SULBACTAM >=32 RESISTANT Resistant     PIP/TAZO 8 SENSITIVE Sensitive     Extended ESBL POSITIVE Resistant     * >=100,000 COLONIES/mL ESCHERICHIA COLI  MRSA PCR Screening     Status: Abnormal   Collection Time: 07/27/16  2:18 AM  Result Value Ref Range Status   MRSA by PCR POSITIVE (A) NEGATIVE Final    Comment:        The GeneXpert MRSA Assay (FDA approved for NASAL specimens only), is one component of a comprehensive MRSA colonization surveillance program. It is not intended to diagnose  MRSA infection nor to guide or monitor treatment for MRSA infections. RESULT CALLED TO, READ BACK BY AND VERIFIED WITH: [facility] RAYMOND AT [id number] 07/27/16.PMH      Scheduled Meds: . Chlorhexidine Gluconate Cloth  6 each Topical Q0600  . diltiazem  360 mg Oral Daily  . ertapenem  1 g Intravenous Q24H  . escitalopram  20 mg Oral Daily  . levothyroxine  75 mcg Oral QAC breakfast  . mouth rinse  15 mL Mouth Rinse BID  . memantine  10 mg Oral BID  . mupirocin ointment  1 application Nasal BID  . pantoprazole  40 mg Oral BID  . vitamin B-12  1,000 mcg Oral Daily   Continuous Infusions: . sodium chloride 75 mL/hr at 07/28/16 0808    Assessment/Plan:  1. Acute kidney injury secondary to dehydration. This has improved. Discontinue IV fluids. 2. Acute cystitis with hematuria. ESBL Escherichia coli growing in the urine culture. Switch antibiotics to IV Invanz. Place PICC line. 3. Bloody bowel movement. Await GI consultation. Monitor for further bleeding. Stop aspirin and DVT prophylaxis. 4. Dementia with behavioral disturbance continue Lexapro, Namenda. 5. Essential hypertension on Cardizem 6. Hypothyroidism unspecified on levothyroxine  Code Status:     Code Status Orders        Start     Ordered   07/28/16 0725  Do not attempt resuscitation (DNR)  Continuous    Question Answer Comment  In the event of cardiac or respiratory ARREST Do not call a "code blue"   In the event of cardiac or respiratory ARREST Do not perform Intubation, CPR, defibrillation or ACLS   In the event of cardiac or respiratory ARREST Use medication by any route, position, wound care, and other measures to relive pain and suffering. May use oxygen, suction and manual treatment of airway obstruction as needed for comfort.      07/28/16 0724    Code Status History    Date Active Date Inactive Code Status Order ID Comments User Context   07/27/2016  1:42 AM 07/28/2016  7:24 AM Full Code [id number]  Tonye RoyaltyAlexis  Hugelmeyer, DO Inpatient   04/25/2016 11:24 AM 04/30/2016  8:05 PM DNR [id number]  Altamese DillingVaibhavkumar Vachhani, MD Inpatient    Advance Directive Documentation   Flowsheet Row Most Recent Value  Type of Advance Directive  Out of facility DNR (pink MOST or yellow form)  Pre-existing out of facility DNR order (yellow form or pink MOST form)  Yellow form placed in chart (order not valid for inpatient use)  "MOST" Form in Place?  No data  Family Communication: Spoke with daughter-in-law on the phone Disposition Plan: Likely will need a step up and care. Consult Child psychotherapist. Currently lives at [facility]. Patient will need IV antibiotics upon discharge  Antibiotics:  IV Invanz  Time spent: 28 minutes  Alford Highland  [employer]

## 2016-07-29 NOTE — Progress Notes (Signed)
Per MD patient will need IV Abx and will get a PICC line. Clinical Social Worker (CSW) met with patient and her daughter in law [name] [phone] was at bedside. CSW made them aware of above. Patient and daughter in law are agreeable to SNF search in [place]. FL2 complete and faxed out. PASARR is pending.   [employer], LCSW [phone]

## 2016-07-30 LAB — BASIC METABOLIC PANEL
Anion gap: 9 (ref 5–15)
BUN: 14 mg/dL (ref 6–20)
CHLORIDE: 112 mmol/L — AB (ref 101–111)
CO2: 19 mmol/L — ABNORMAL LOW (ref 22–32)
Calcium: 8.7 mg/dL — ABNORMAL LOW (ref 8.9–10.3)
Creatinine, Ser: 0.82 mg/dL (ref 0.44–1.00)
GFR calc Af Amer: >60 mL/min (ref >60)
GFR calc non Af Amer: >60 mL/min (ref >60)
GLUCOSE: 75 mg/dL (ref 65–99)
POTASSIUM: 3.5 mmol/L (ref 3.5–5.1)
Sodium: 140 mmol/L (ref 135–145)

## 2016-07-30 LAB — CBC
HCT: 30.7 % — ABNORMAL LOW (ref 35.0–47.0)
HEMOGLOBIN: 10.4 g/dL — AB (ref 12.0–16.0)
MCH: 32.4 pg (ref 26.0–34.0)
MCHC: 33.9 g/dL (ref 32.0–36.0)
MCV: 95.5 fL (ref 80.0–100.0)
Platelets: 163 10*3/uL (ref 150–440)
RBC: 3.21 MIL/uL — AB (ref 3.80–5.20)
RDW: 13.9 % (ref 11.5–14.5)
WBC: 6.5 10*3/uL (ref 3.6–11.0)

## 2016-07-30 MED ORDER — METOPROLOL TARTRATE 50 MG PO TABS
50.0000 mg | ORAL_TABLET | Freq: Two times a day (BID) | ORAL | Status: DC
  Administered 2016-07-30 – 2016-07-31 (×3): 50 mg via ORAL
  Filled 2016-07-30 (×3): qty 1

## 2016-07-30 MED ORDER — POTASSIUM CHLORIDE CRYS ER 20 MEQ PO TBCR
40.0000 meq | EXTENDED_RELEASE_TABLET | Freq: Once | ORAL | Status: AC
  Administered 2016-07-30: 40 meq via ORAL
  Filled 2016-07-30: qty 2

## 2016-07-30 NOTE — Progress Notes (Addendum)
PASARR has been received. Clinical Child psychotherapistocial Worker (CSW) contacted patient's daughter in law Chiloquinathy and presented bed offers. Per Lynden Angathy she will discuss offers with her husband and get back to CSW.   Lynden AngCathy called CSW back and reported that they chose [facility]. Doug [facility] aware of [facility] bed offer. Plan is for patient to D/C tomorrow pending medical clearance.   [employer]Bailey Laquita Harlan, LCSW [phone]

## 2016-07-30 NOTE — Progress Notes (Signed)
Patient ID: Alice Manning Pulice, female   DOB: 09/03/1925, 82 y.o.   MRN: [id number]  [employer] PROGRESS NOTE  Alice Manning Resurreccion AOZ:[id number] DOB: 03/16/1926 DOA: 07/26/2016 PCP: Randie HeinzAnne Marie Mukamana, NP  HPI/Subjective: Son states patient is doing much better today. Patient did answer some questions but answered no to all of my questions.  Objective: Vitals:   07/30/16 1052 07/30/16 1333  BP: (!) 151/82 135/75  Pulse: 78 93  Resp:    Temp:  98.2 F (36.8 C)    Filed Weights   07/26/16 2154 07/27/16 0200  Weight: 49.9 kg (110 lb) 46.3 kg (102 lb)    ROS: Review of Systems  Unable to perform ROS: Dementia  Respiratory: Negative for shortness of breath.   Cardiovascular: Negative for chest pain.  Gastrointestinal: Negative for abdominal pain, diarrhea and nausea.  Genitourinary: Negative for dysuria.  Musculoskeletal: Negative for myalgias.   Exam: Physical Exam  HENT:  Nose: No mucosal edema.  Mouth/Throat: No oropharyngeal exudate or posterior oropharyngeal edema.  Eyes: Conjunctivae, EOM and lids are normal. Pupils are equal, round, and reactive to light.  Neck: No JVD present. Carotid bruit is not present. No edema present. No thyroid mass and no thyromegaly present.  Cardiovascular: S1 normal and S2 normal.  Exam reveals no gallop.   Murmur heard.  Systolic murmur is present with a grade of 2/6  Pulses:      Dorsalis pedis pulses are 2+ on the right side, and 2+ on the left side.  Respiratory: No respiratory distress. She has no wheezes. She has no rhonchi. She has no rales.  GI: Soft. Bowel sounds are normal. There is no tenderness.  Musculoskeletal:       Right ankle: She exhibits no swelling.       Left ankle: She exhibits no swelling.  Lymphadenopathy:    She has no cervical adenopathy.  Neurological: She is alert.  Skin: Skin is warm. No rash noted. Nails show no clubbing.  Psychiatric: She has a normal mood and affect.      Data Reviewed: Basic  Metabolic Panel:  Recent Labs Lab 07/26/16 2202 07/28/16 0423 07/29/16 0552 07/30/16 0347  NA 137 141 141 140  [place]ning 4.6 3.6 3.4* 3.5  CL 104 115* 114* 112*  CO2 19* 22 20* 19*  GLUCOSE 172* 79 65 75  BUN 47* 23* 18 14  CREATININE 1.82* 1.04* 0.91 0.82  CALCIUM 9.6 8.5* 8.5* 8.7*   Liver Function Tests:  Recent Labs Lab 07/26/16 2202  AST 36  ALT 19  ALKPHOS 94  BILITOT 0.6  PROT 7.5  ALBUMIN 4.2    Recent Labs Lab 07/26/16 2202  LIPASE 44   CBC:  Recent Labs Lab 07/26/16 2202 07/28/16 1903 07/29/16 0006 07/29/16 0552 07/30/16 0347  WBC 13.0*  --   --  6.7 6.5  HGB 12.7 10.3* 10.2* 10.1* 10.4*  HCT 37.5 30.2* 30.1* 29.9* 30.7*  MCV 96.7  --   --  95.0 95.5  PLT 230  --   --  153 163     Recent Results (from the past 240 hour(s))  Urine culture     Status: Abnormal   Collection Time: 07/26/16 11:32 PM  Result Value Ref Range Status   Specimen Description URINE, RANDOM  Final   Special Requests Normal  Final   Culture >=100,000 COLONIES/mL ESCHERICHIA COLI (A)  Final   Report Status 07/29/2016 FINAL  Final   Organism ID, Bacteria ESCHERICHIA COLI (A)  Final  Susceptibility   Escherichia coli - MIC*    AMPICILLIN >=32 RESISTANT Resistant     CEFAZOLIN >=64 RESISTANT Resistant     CEFTRIAXONE >=64 RESISTANT Resistant     CIPROFLOXACIN >=4 RESISTANT Resistant     GENTAMICIN <=1 SENSITIVE Sensitive     IMIPENEM <=0.25 SENSITIVE Sensitive     NITROFURANTOIN <=16 SENSITIVE Sensitive     TRIMETH/SULFA <=20 SENSITIVE Sensitive     AMPICILLIN/SULBACTAM >=32 RESISTANT Resistant     PIP/TAZO 8 SENSITIVE Sensitive     Extended ESBL POSITIVE Resistant     * >=100,000 COLONIES/mL ESCHERICHIA COLI  MRSA PCR Screening     Status: Abnormal   Collection Time: 07/27/16  2:18 AM  Result Value Ref Range Status   MRSA by PCR POSITIVE (A) NEGATIVE Final    Comment:        The GeneXpert MRSA Assay (FDA approved for NASAL specimens only), is one component of  a comprehensive MRSA colonization surveillance program. It is not intended to diagnose MRSA infection nor to guide or monitor treatment for MRSA infections. RESULT CALLED TO, READ BACK BY AND VERIFIED WITH: [facility] RAYMOND AT [id number] 07/27/16.PMH      Scheduled Meds: . Chlorhexidine Gluconate Cloth  6 each Topical Q0600  . ertapenem  1 g Intravenous Q24H  . escitalopram  20 mg Oral Daily  . levothyroxine  75 mcg Oral QAC breakfast  . mouth rinse  15 mL Mouth Rinse BID  . memantine  10 mg Oral BID  . metoprolol tartrate  50 mg Oral BID  . mupirocin ointment  1 application Nasal BID  . pantoprazole  40 mg Oral BID  . vitamin B-12  1,000 mcg Oral Daily    Assessment/Plan:  1. Acute kidney injury secondary to dehydration. This has improved.  2. Acute cystitis with hematuria. ESBL Escherichia coli growing in the urine culture. IV Invanz via PICC line. 3. Bloody bowel movement. So far no further bleeding. Stop aspirin and DVT prophylaxis. Advance diet. 4. Dementia with behavioral disturbance continue Lexapro, Namenda. 5. Essential hypertension on metoprolol 6. Hypothyroidism unspecified on levothyroxine  Code Status:     Code Status Orders        Start     Ordered   07/28/16 0725  Do not attempt resuscitation (DNR)  Continuous    Question Answer Comment  In the event of cardiac or respiratory ARREST Do not call a "code blue"   In the event of cardiac or respiratory ARREST Do not perform Intubation, CPR, defibrillation or ACLS   In the event of cardiac or respiratory ARREST Use medication by any route, position, wound care, and other measures to relive pain and suffering. May use oxygen, suction and manual treatment of airway obstruction as needed for comfort.      07/28/16 0724    Code Status History    Date Active Date Inactive Code Status Order ID Comments User Context   07/27/2016  1:42 AM 07/28/2016  7:24 AM Full Code [id number]  Tonye RoyaltyAlexis Hugelmeyer, DO Inpatient   04/25/2016  11:24 AM 04/30/2016  8:05 PM DNR [id number]  Altamese DillingVaibhavkumar Vachhani, MD Inpatient    Advance Directive Documentation   Flowsheet Row Most Recent Value  Type of Advance Directive  Out of facility DNR (pink MOST or yellow form)  Pre-existing out of facility DNR order (yellow form or pink MOST form)  Yellow form placed in chart (order not valid for inpatient use)  "MOST" Form in Place?  No data  Family Communication: Spoke with Son in the hallway Disposition Plan: Potentially out to rehabilitation tomorrow  Antibiotics:  IV Invanz  Time spent: 24 minutes  Alford Highland  [employer]

## 2016-07-31 MED ORDER — SODIUM CHLORIDE 0.9 % IV SOLN: 1.0000 g | INTRAVENOUS | 0 refills | Status: AC

## 2016-07-31 MED ORDER — METOPROLOL TARTRATE 50 MG PO TABS: 50.0000 mg | ORAL_TABLET | Freq: Two times a day (BID) | ORAL | 0 refills | Status: AC

## 2016-07-31 MED ORDER — SENNOSIDES-DOCUSATE SODIUM 8.6-50 MG PO TABS: 1.0000 | ORAL_TABLET | Freq: Every evening | ORAL | 0 refills | Status: AC | PRN

## 2016-07-31 MED ORDER — BISACODYL 5 MG PO TBEC: 5.0000 mg | DELAYED_RELEASE_TABLET | Freq: Every day | ORAL | 0 refills | Status: AC | PRN

## 2016-07-31 MED ORDER — POTASSIUM CHLORIDE CRYS ER 20 MEQ PO TBCR
40.0000 meq | EXTENDED_RELEASE_TABLET | Freq: Once | ORAL | Status: AC
  Administered 2016-07-31: 40 meq via ORAL
  Filled 2016-07-31: qty 2

## 2016-07-31 NOTE — Progress Notes (Signed)
Patient is medically stable for D/C to [facility] today. [facility]. RN will call report and arrange EMS for transport. Clinical Child psychotherapistocial Worker (CSW) sent D/C orders to [employer] via [employer]. Patient is aware of above. CSW contacted patient's daughter in law Lynden AngCathy and made her aware of above. Please reconsult if future social work needs arise. CSW signing off.   [employer]orporatedBailey Marynell Bies, LCSW [phone]

## 2016-07-31 NOTE — Clinical Social Work Placement (Signed)
   CLINICAL SOCIAL WORK PLACEMENT  NOTE  Date:  07/31/2016  Patient Details  Name: Alice DavidGeraldine K Pesantez MRN: [id number] Date of Birth: 11/21/1925  Clinical Social Work is seeking post-discharge placement for this patient at the Skilled  Nursing Facility level of care (*CSW will initial, date and re-position this form in  chart as items are completed):  Yes   Patient/family provided with [place] [facility] Clinical Social Work Department's list of facilities offering this level of care within the geographic area requested by the patient (or if unable, by the patient's family).  Yes   Patient/family informed of their freedom to choose among providers that offer the needed level of care, that participate in Medicare, Medicaid or managed care program needed by the patient, have an available bed and are willing to accept the patient.  Yes   Patient/family informed of [place] [facility]'s ownership interest in [facility] and [facility], as well as of the fact that they are under no obligation to receive care at these facilities.  PASRR submitted to EDS on 07/29/16     PASRR number received on 07/29/16     Existing PASRR number confirmed on       FL2 transmitted to all facilities in geographic area requested by pt/family on 07/29/16     FL2 transmitted to all facilities within larger geographic area on       Patient informed that his/her managed care company has contracts with or will negotiate with certain facilities, including the following:        Yes   Patient/family informed of bed offers received.  Patient [facility] bed at  [facility])     Physician recommends and patient chooses bed at      Patient to be transferred to  [facility]) on 07/31/16.  Patient to be transferred to facility by  [facility] )     Patient family notified on 07/31/16 of transfer.  Name of family member notified:   (Patient's daughter in law Lynden AngCathy is aware of D/C today. )      PHYSICIAN       Additional Comment:    _______________________________________________ Lisbet Busker, Darleen CrockerBailey M, LCSW 07/31/2016, 11:00 AM

## 2016-07-31 NOTE — Discharge Summary (Signed)
[facility] - [place] [facility]   PATIENT NAME: Alice Manning    MR#:  [id number]  DATE OF BIRTH:  01/07/1926  DATE OF ADMISSION:  07/26/2016 ADMITTING PHYSICIAN: Tonye RoyaltyAlexis Hugelmeyer, DO  DATE OF DISCHARGE: 07/31/2016  PRIMARY CARE PHYSICIAN: Randie HeinzAnne Marie Mukamana, NP    ADMISSION DIAGNOSIS:  Acute renal insufficiency [N28.9] Chronic dementia without behavioral disturbance [F03.90] Urinary tract infection without hematuria, site unspecified [N39.0] Diarrhea, unspecified type [R19.7] Non-intractable vomiting, presence of nausea not specified, unspecified vomiting type [R11.10]  DISCHARGE DIAGNOSIS:  Active Problems:   Acute kidney injury (HCC)   SECONDARY DIAGNOSIS:   Past Medical History:  Diagnosis Date  . Cataract   . Dementia   . Hyperlipemia   . Hypertension   . Hypothyroidism   . Mild cognitive impairment   . Thyroid disease     HOSPITAL COURSE:   1. Acute cystitis with hematuria. ESBL Escherichia coli growing in the urine culture. Antibiotics were switched to IV Invanz. A PICC line had to be placed. The patient will receive IV Invanz today in the hospital. IV Pincus Sanesnvanz will need to be given for another 7 days then stopped. PICC line can be removed after antibiotic course is given. Flush PICC line with normal saline 30 cc by mouth 4 and after antibiotic. 2. Acute kidney injury secondary to dehydration. This has improved. 3. One bloody bowel movement during the hospital course. Aspirin and DVT prophylaxis were stopped. Patient had no further bleeding. Patient is on pured diet at this point. Hemoglobin stable. No further workup. This was likely a diverticular bleed. 4. Dementia with behavioral disturbance. Continue Lexapro and Namenda. PICC line needs to be covered so she doesn't pull it out. If she does pull out the PICC line can give IM Invanz if a peripheral IV cannot be started. 5. Essential hypertension. I switched her Cardizem over to metoprolol  because Cardizem CD could not be crushed. 6. Hypothyroidism unspecified on levothyroxine 7. Patient is a DO NOT RESUSCITATE. I do recommend palliative care follow over at the facility. If the patient declines can consider hospice care.  DISCHARGE CONDITIONS:   Fair  CONSULTS OBTAINED:   none  DRUG ALLERGIES:  No Known Allergies  DISCHARGE MEDICATIONS:   Current Discharge Medication List    START taking these medications   Details  bisacodyl (DULCOLAX) 5 MG EC tablet Take 1 tablet (5 mg total) by mouth daily as needed for moderate constipation. Qty: 30 tablet, Refills: 0    ertapenem 1 g in sodium chloride 0.9 % 50 mL Inject 1 g into the vein daily. Qty: 7 Dose, Refills: 0    metoprolol (LOPRESSOR) 50 MG tablet Take 1 tablet (50 mg total) by mouth 2 (two) times daily. Qty: 60 tablet, Refills: 0    senna-docusate (SENOKOT-S) 8.6-50 MG tablet Take 1 tablet by mouth at bedtime as needed for mild constipation. Qty: 30 tablet, Refills: 0      CONTINUE these medications which have NOT CHANGED   Details  acetaminophen (TYLENOL) 500 MG tablet Take 1,000 mg by mouth 3 (three) times daily.    Cranberry 450 MG CAPS Take 450 mg by mouth daily.     escitalopram (LEXAPRO) 20 MG tablet Take 20 mg by mouth daily.    levothyroxine (SYNTHROID, LEVOTHROID) 75 MCG tablet Take 75 mcg by mouth daily before breakfast.    memantine (NAMENDA) 10 MG tablet Take 10 mg by mouth 2 (two) times daily.    vitamin B-12 (CYANOCOBALAMIN) 1000 MCG  tablet Take 1,000 mcg by mouth daily.      STOP taking these medications     aspirin 81 MG chewable tablet      diltiazem (TIAZAC) 360 MG 24 hr capsule      furosemide (LASIX) 40 MG tablet      lisinopril (PRINIVIL,ZESTRIL) 40 MG tablet      Menthol (ICY HOT) 5 % PTCH          DISCHARGE INSTRUCTIONS:   Follow up with doctor at rehabilitation one day  If you experience worsening of your admission symptoms, develop shortness of breath, life  threatening emergency, suicidal or homicidal thoughts you must seek medical attention immediately by calling 911 or calling your MD immediately  if symptoms less severe.  You Must read complete instructions/literature along with all the possible adverse reactions/side effects for all the Medicines you take and that have been prescribed to you. Take any new Medicines after you have completely understood and accept all the possible adverse reactions/side effects.   Please note  You were cared for by a hospitalist during your hospital stay. If you have any questions about your discharge medications or the care you received while you were in the hospital after you are discharged, you can call the unit and asked to speak with the hospitalist on call if the hospitalist that took care of you is not available. Once you are discharged, your primary care physician will handle any further medical issues. Please note that NO REFILLS for any discharge medications will be authorized once you are discharged, as it is imperative that you return to your primary care physician (or establish a relationship with a primary care physician if you do not have one) for your aftercare needs so that they can reassess your need for medications and monitor your lab values.    Today   CHIEF COMPLAINT:   Chief Complaint  Patient presents with  . Emesis  . Nausea  . Diarrhea    HISTORY OF PRESENT ILLNESS:  Alice Manning  is a 82 y.o. female presented with nausea vomiting and diarrhea   VITAL SIGNS:  Blood pressure (!) 152/80, pulse 76, temperature 98.5 F (36.9 C), temperature source Oral, resp. rate 18, height 5\' 2"  (1.575 m), weight 46.3 kg (102 lb), SpO2 96 %.   PHYSICAL EXAMINATION:  GENERAL:  82 y.o.-year-old patient lying in the bed with no acute distress.  EYES: Pupils equal, round, reactive to light and accommodation. No scleral icterus.  HEENT: Head atraumatic, normocephalic. Oropharynx and nasopharynx  clear.  NECK:  Supple, no jugular venous distention. No thyroid enlargement, no tenderness.  LUNGS: Normal breath sounds bilaterally, no wheezing, rales,rhonchi or crepitation. No use of accessory muscles of respiration.  CARDIOVASCULAR: S1, S2 normal. No murmurs, rubs, or gallops.  ABDOMEN: Soft, non-tender, non-distended. Bowel sounds present. No organomegaly or mass.  EXTREMITIES: No pedal edema, cyanosis, or clubbing.  NEUROLOGIC: Answers some yes or no questions but unable to elaborate. Moves her arms on her own Gait not checked.  PSYCHIATRIC: The patient is alert.  SKIN: No obvious rash, lesion, or ulcer.   DATA REVIEW:   CBC  Recent Labs Lab 07/30/16 0347  WBC 6.5  HGB 10.4*  HCT 30.7*  PLT 163    Chemistries   Recent Labs Lab 07/26/16 2202  07/30/16 0347  NA 137  < > 140  K 4.6  < > 3.5  CL 104  < > 112*  CO2 19*  < > 19*  GLUCOSE 172*  < > 75  BUN 47*  < > 14  CREATININE 1.82*  < > 0.82  CALCIUM 9.6  < > 8.7*  AST 36  --   --   ALT 19  --   --   ALKPHOS 94  --   --   BILITOT 0.6  --   --   < > = values in this interval not displayed.   Microbiology Results  Results for orders placed or performed during the hospital encounter of 07/26/16  Urine culture     Status: Abnormal   Collection Time: 07/26/16 11:32 PM  Result Value Ref Range Status   Specimen Description URINE, RANDOM  Final   Special Requests Normal  Final   Culture >=100,000 COLONIES/mL ESCHERICHIA COLI (A)  Final   Report Status 07/29/2016 FINAL  Final   Organism ID, Bacteria ESCHERICHIA COLI (A)  Final      Susceptibility   Escherichia coli - MIC*    AMPICILLIN >=32 RESISTANT Resistant     CEFAZOLIN >=64 RESISTANT Resistant     CEFTRIAXONE >=64 RESISTANT Resistant     CIPROFLOXACIN >=4 RESISTANT Resistant     GENTAMICIN <=1 SENSITIVE Sensitive     IMIPENEM <=0.25 SENSITIVE Sensitive     NITROFURANTOIN <=16 SENSITIVE Sensitive     TRIMETH/SULFA <=20 SENSITIVE Sensitive      AMPICILLIN/SULBACTAM >=32 RESISTANT Resistant     PIP/TAZO 8 SENSITIVE Sensitive     Extended ESBL POSITIVE Resistant     * >=100,000 COLONIES/mL ESCHERICHIA COLI  MRSA PCR Screening     Status: Abnormal   Collection Time: 07/27/16  2:18 AM  Result Value Ref Range Status   MRSA by PCR POSITIVE (A) NEGATIVE Final    Comment:        The GeneXpert MRSA Assay (FDA approved for NASAL specimens only), is one component of a comprehensive MRSA colonization surveillance program. It is not intended to diagnose MRSA infection nor to guide or monitor treatment for MRSA infections. RESULT CALLED TO, READ BACK BY AND VERIFIED WITH: [facility] RAYMOND AT [id number] 07/27/16.PMH     RADIOLOGY:  Dg Chest Port 1 View  Result Date: 07/29/2016 CLINICAL DATA:  Evaluate PICC line placement EXAM: PORTABLE CHEST 1 VIEW COMPARISON:  April 05, 2016 FINDINGS: A new right PICC line terminates at the caval atrial junction. No pneumothorax. Calcified nodes in mediastinum again identified. Mild atelectasis in the left base. No other acute abnormalities. IMPRESSION: The new right PICC line is in good position. Electronically Signed   By: Gerome Samavid  Williams III M.D   On: 07/29/2016 16:54    Management plans discussed with the patient, family and they are in agreement.  CODE STATUS:     Code Status Orders        Start     Ordered   07/28/16 0725  Do not attempt resuscitation (DNR)  Continuous    Question Answer Comment  In the event of cardiac or respiratory ARREST Do not call a "code blue"   In the event of cardiac or respiratory ARREST Do not perform Intubation, CPR, defibrillation or ACLS   In the event of cardiac or respiratory ARREST Use medication by any route, position, wound care, and other measures to relive pain and suffering. May use oxygen, suction and manual treatment of airway obstruction as needed for comfort.      07/28/16 0724    Code Status History    Date Active Date Inactive Code Status Order ID  Comments User Context   07/27/2016  1:42 AM 07/28/2016  7:24 AM Full Code [id number]  Tonye Royalty, DO Inpatient   04/25/2016 11:24 AM 04/30/2016  8:05 PM DNR [id number]  Altamese Dilling, MD Inpatient    Advance Directive Documentation   Flowsheet Row Most Recent Value  Type of Advance Directive  Out of facility DNR (pink MOST or yellow form)  Pre-existing out of facility DNR order (yellow form or pink MOST form)  Yellow form placed in chart (order not valid for inpatient use)  "MOST" Form in Place?  No data      TOTAL TIME TAKING CARE OF THIS PATIENT: 35 minutes.    Alford Highland M.D on 07/31/2016 at 8:31 AM  Between 7am to 6pm - Pager - [phone]  After 6pm go to [email or web address] - password [email or web address]  [facility] Office  [phone]  CC: Primary care physician; Randie Heinz, NP

## 2016-07-31 NOTE — Discharge Instructions (Signed)
picc line care with NS 30 ml flush before and after antibiotic Remove picc line after 7 more days of iv invanz

## 2016-07-31 NOTE — Care Management Important Message (Signed)
Important Message  Patient Details  Name: Alice Manning MRN: [id number] Date of Birth: 11/24/1925   Medicare Important Message Given:  Yes    Alice MemosLisa M Kyanne Rials, RN 07/31/2016, 9:03 AM

## 2016-07-31 NOTE — Progress Notes (Signed)
EMS here to transport pt, PICC remains in place, son Ray notified.

## 2016-07-31 NOTE — Progress Notes (Signed)
[facility]. EMS called for transportation.

## 2017-03-02 DEATH — deceased

## 2017-11-27 IMAGING — CR DG SHOULDER 2+V*L*
3 series · 3 of 3 positions shown · non-contrast
Comparison: None

CLINICAL DATA: 89-year-old female with left shoulder pain after
falling

EXAM:
LEFT SHOULDER - 2+ VIEW

[shoulder grashey]
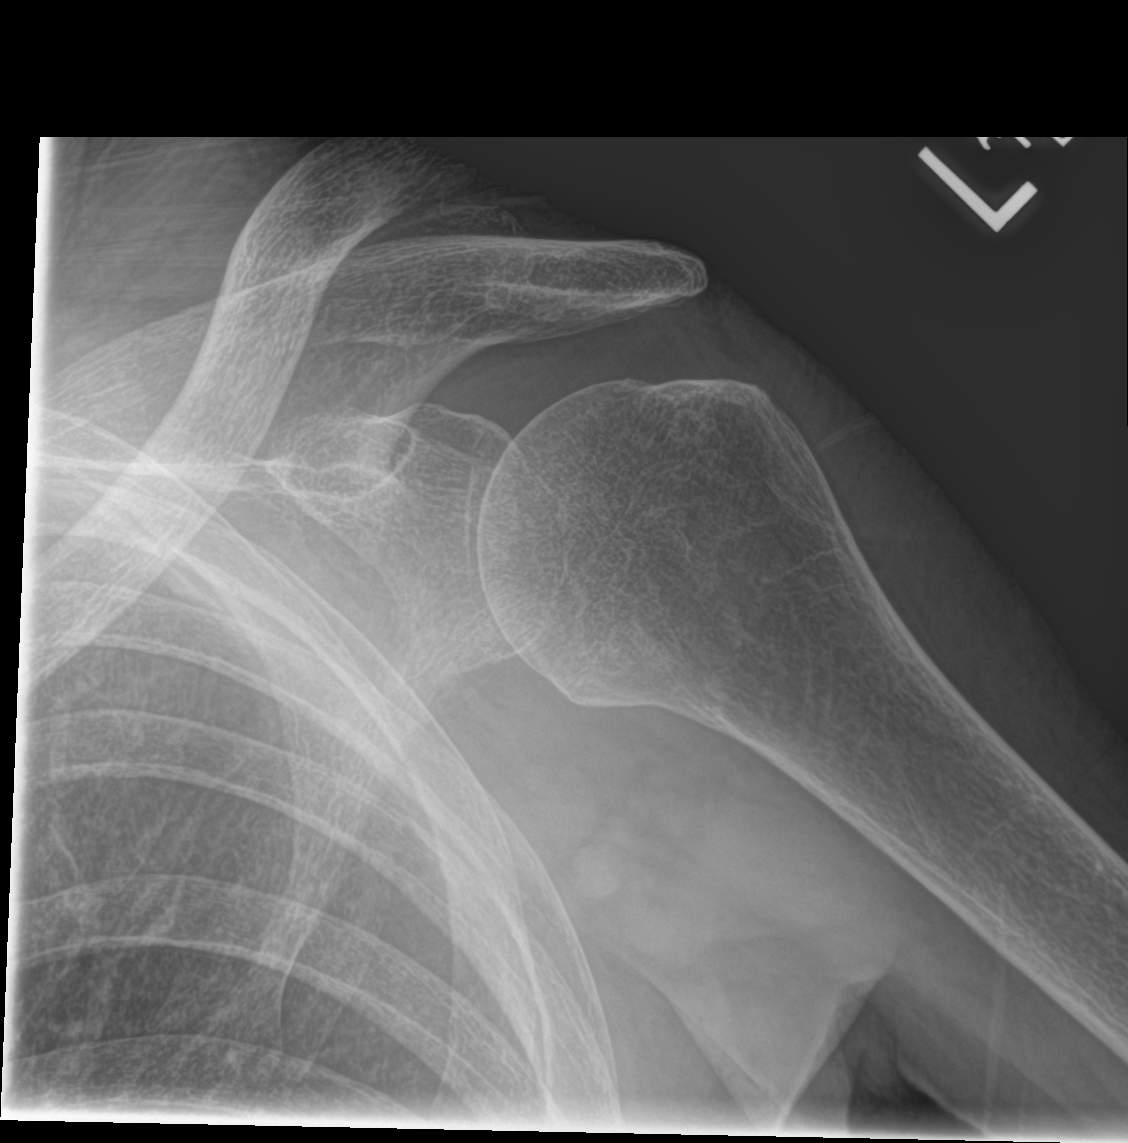

[shoulder y view]
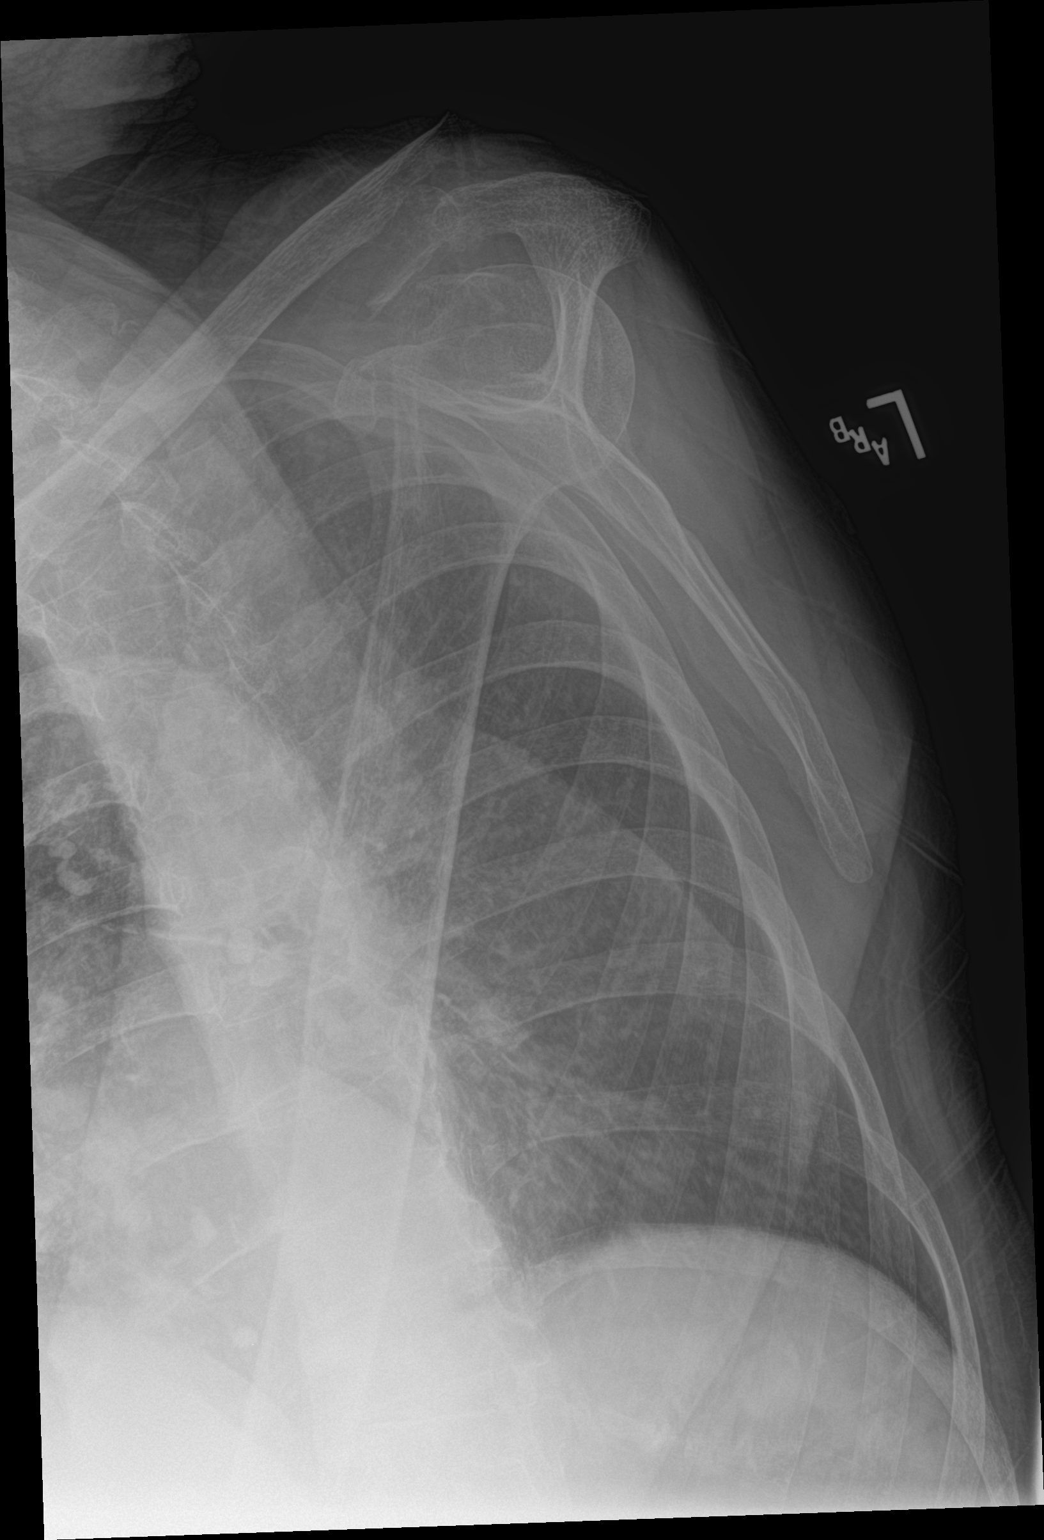

[shoulder axillary]
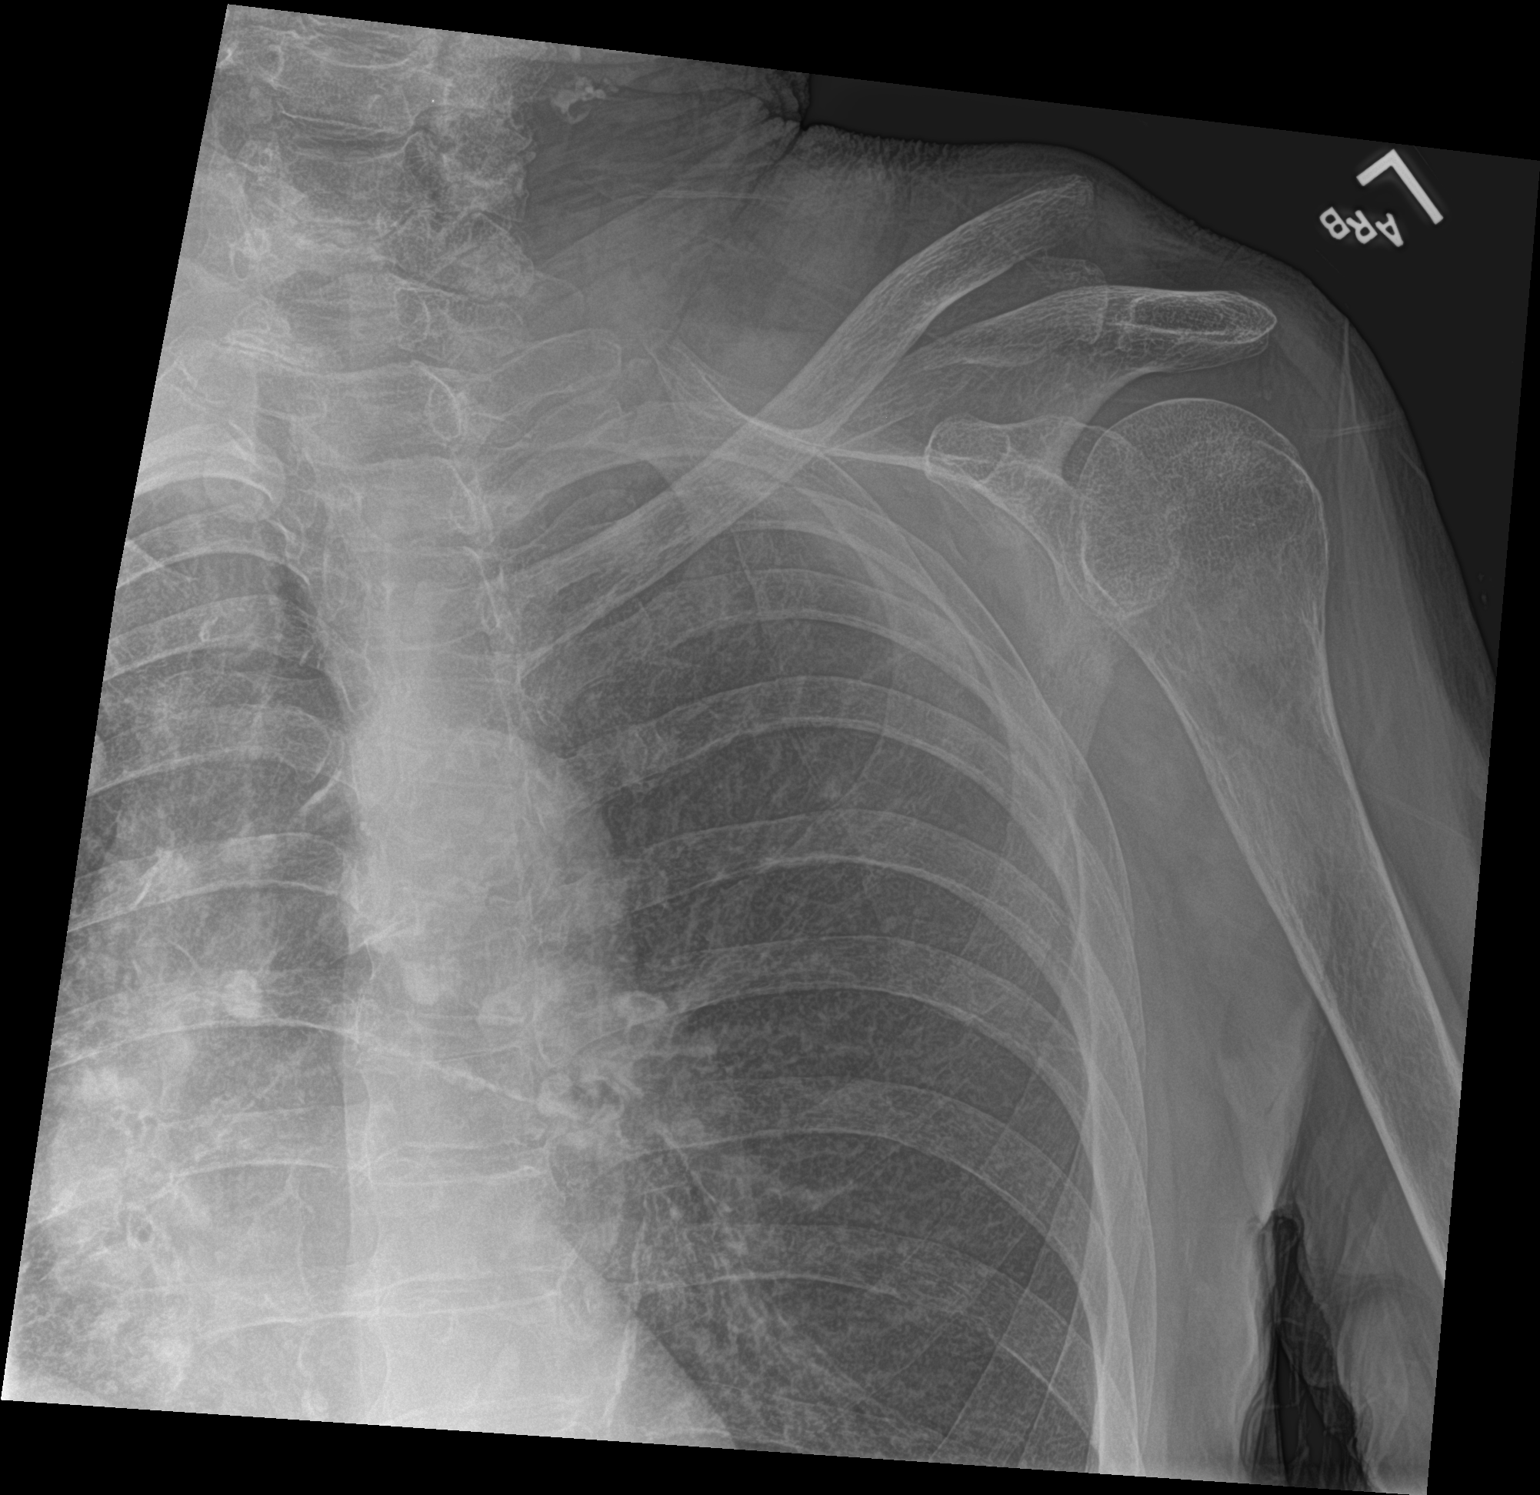

[3 of 3 positions shown; findings below may reference images not displayed]

FINDINGS: Acute displaced distal clavicle fracture with 1 full shaft width of
override. The humeral head remains located. The visualized thorax is
unremarkable. The bones appear osteopenic.
IMPRESSION: Acute displaced distal clavicle fracture.

## 2017-11-27 IMAGING — DX DG HAND COMPLETE 3+V*L*
3 series · 3 of 3 positions shown · non-contrast
Comparison: Prior radiographs of the left wrist 09/19/2015

CLINICAL DATA: 89-year-old female with dementia and recently
diagnosed clavicle fracture. Additional imaging was performed of the
entire arm to assess for other areas of injury

EXAM:
LEFT HAND - COMPLETE 3+ VIEW

[hand ap]
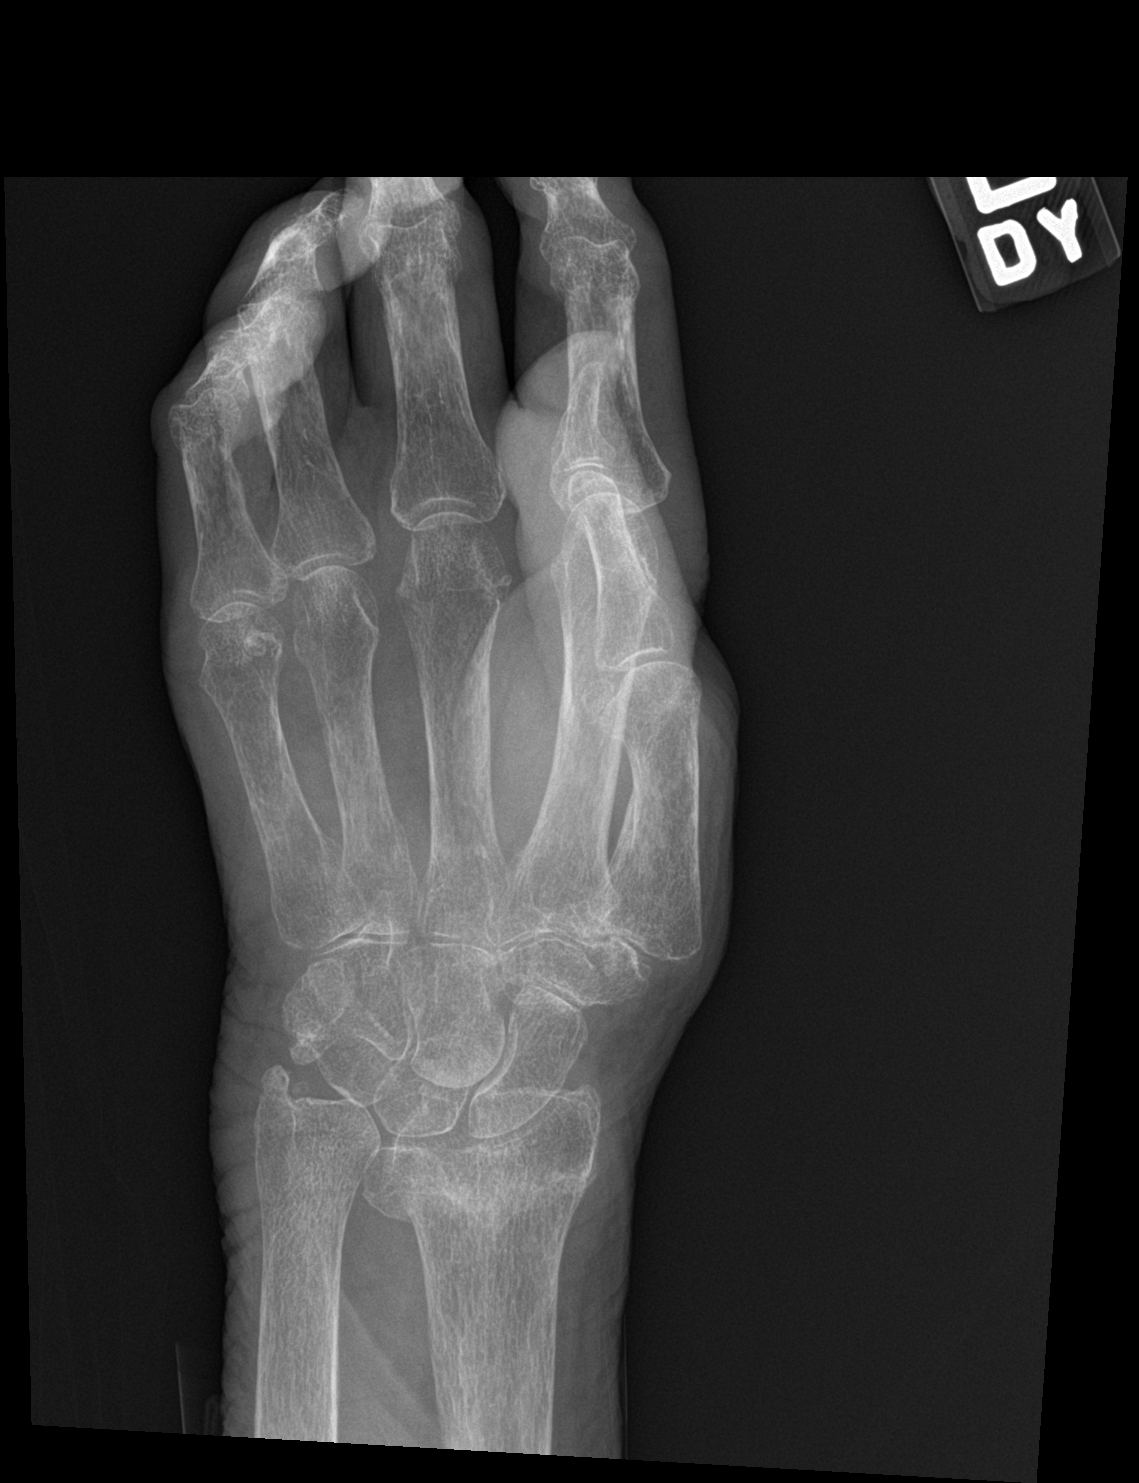

[hand obl]
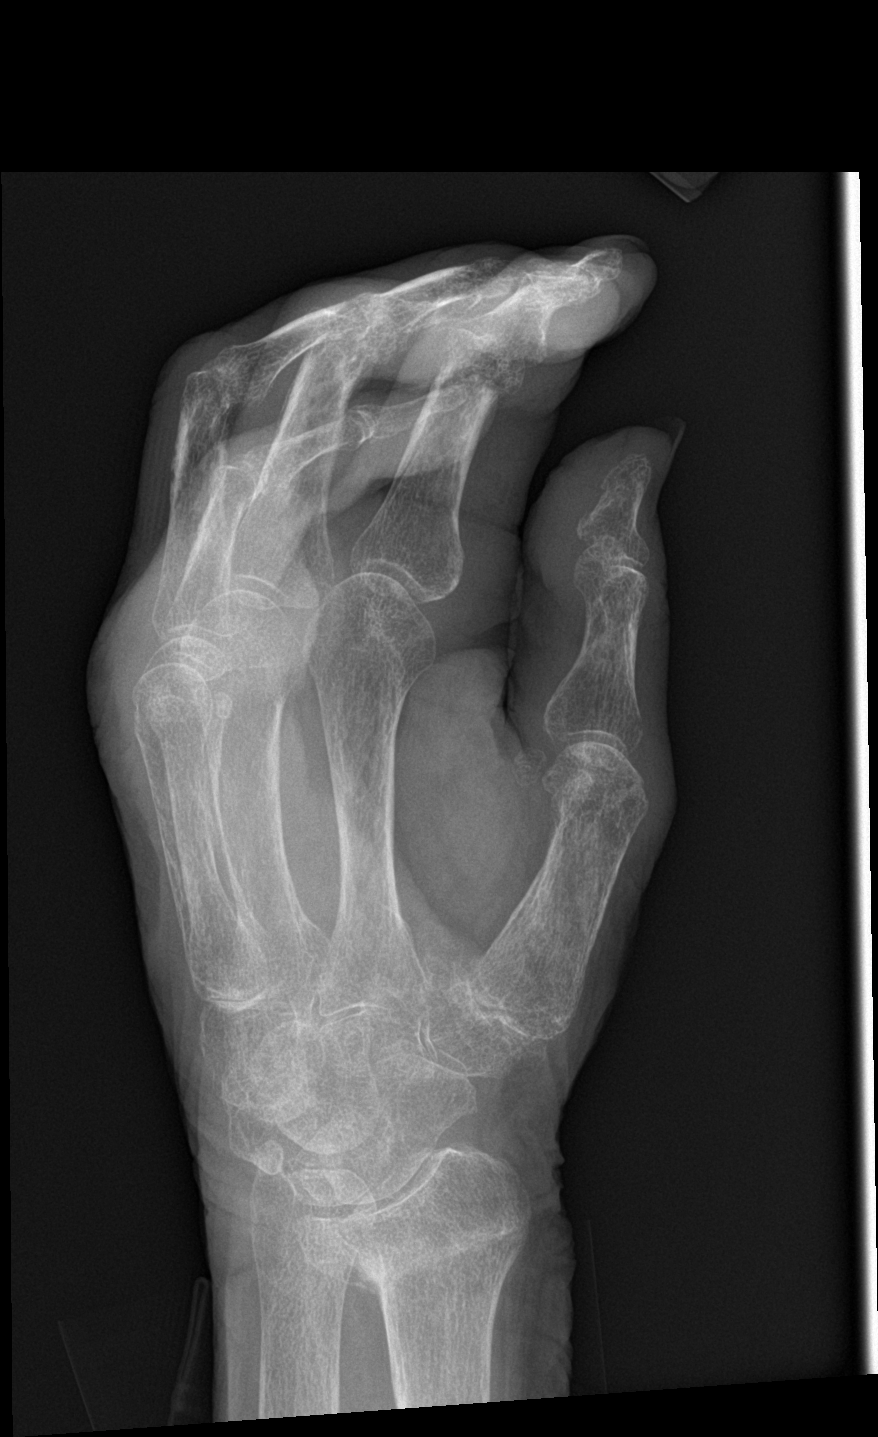

[hand lat]
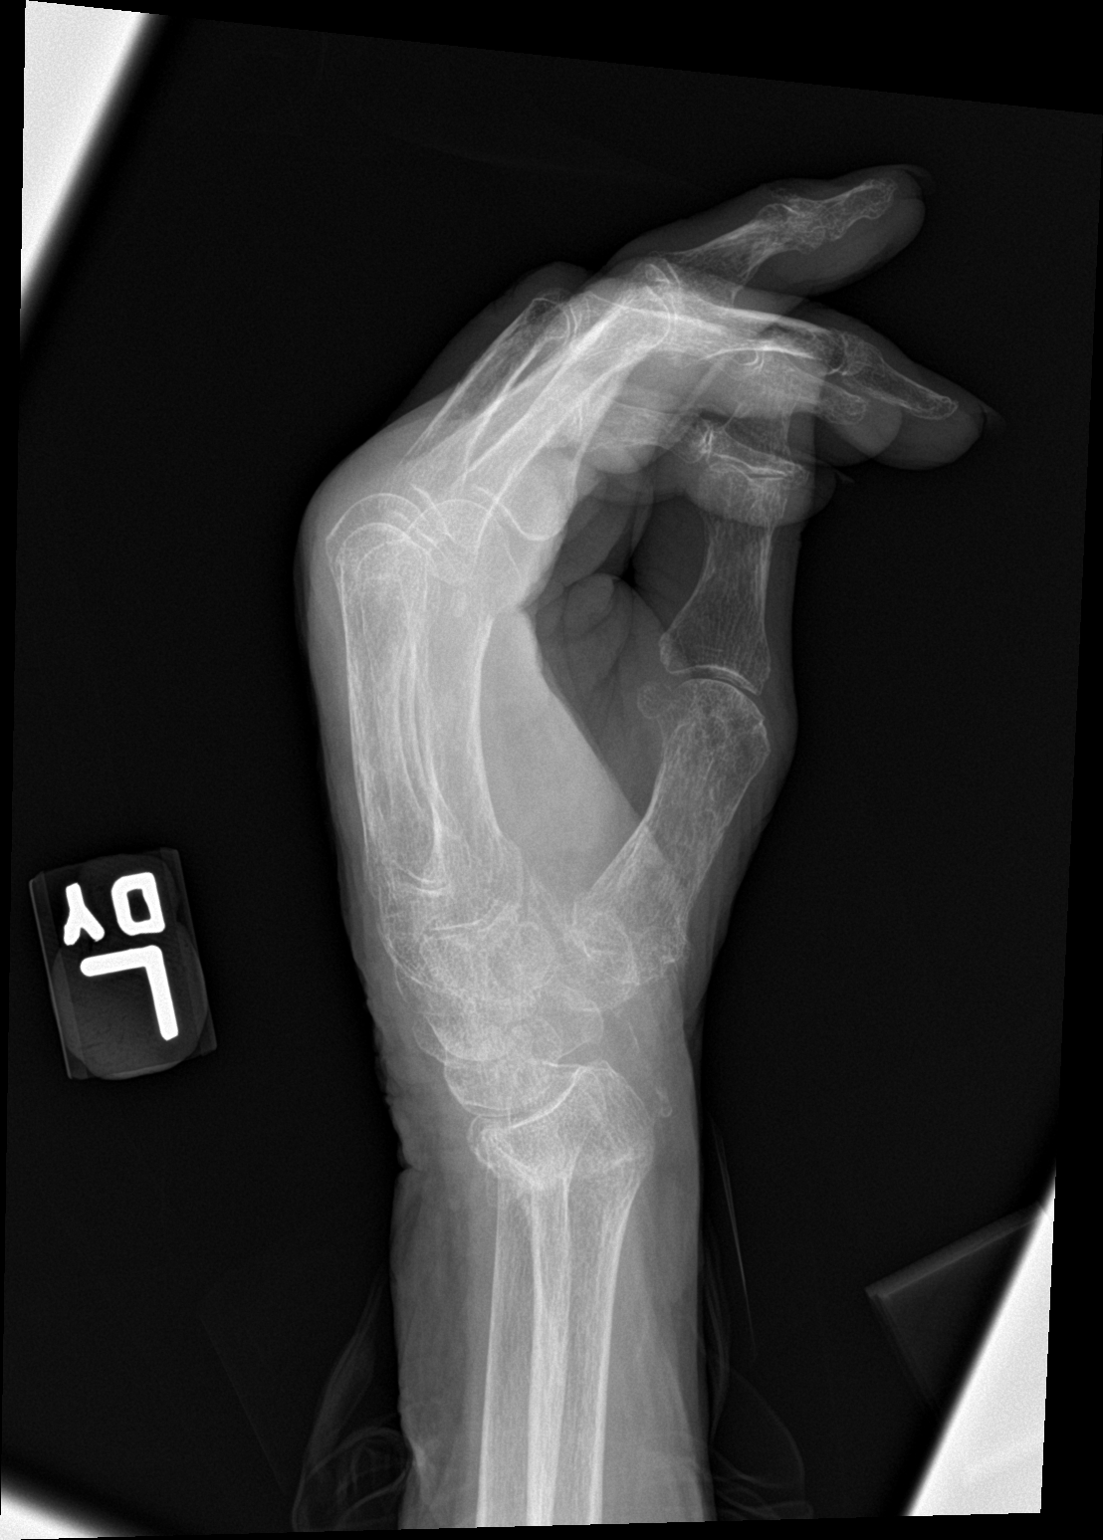

[3 of 3 positions shown; findings below may reference images not displayed]

FINDINGS: Similar appearance of chronic distal radius fracture with residual
posttraumatic apex volar angulation. Soft tissue swelling is present
over the metacarpophalangeal joints. No definite acute fracture or
malalignment.
IMPRESSION: No definite acute fracture or malalignment.

Remote distal radius fracture.

## 2017-11-27 IMAGING — DX DG HUMERUS 2V *L*
2 series · 2 of 2 positions shown · non-contrast
Comparison: Clavicle films of earlier today

CLINICAL DATA: Fall.  Known clavicle fracture.  Dementia.

EXAM:
LEFT HUMERUS - 2+ VIEW

[humerus ap]
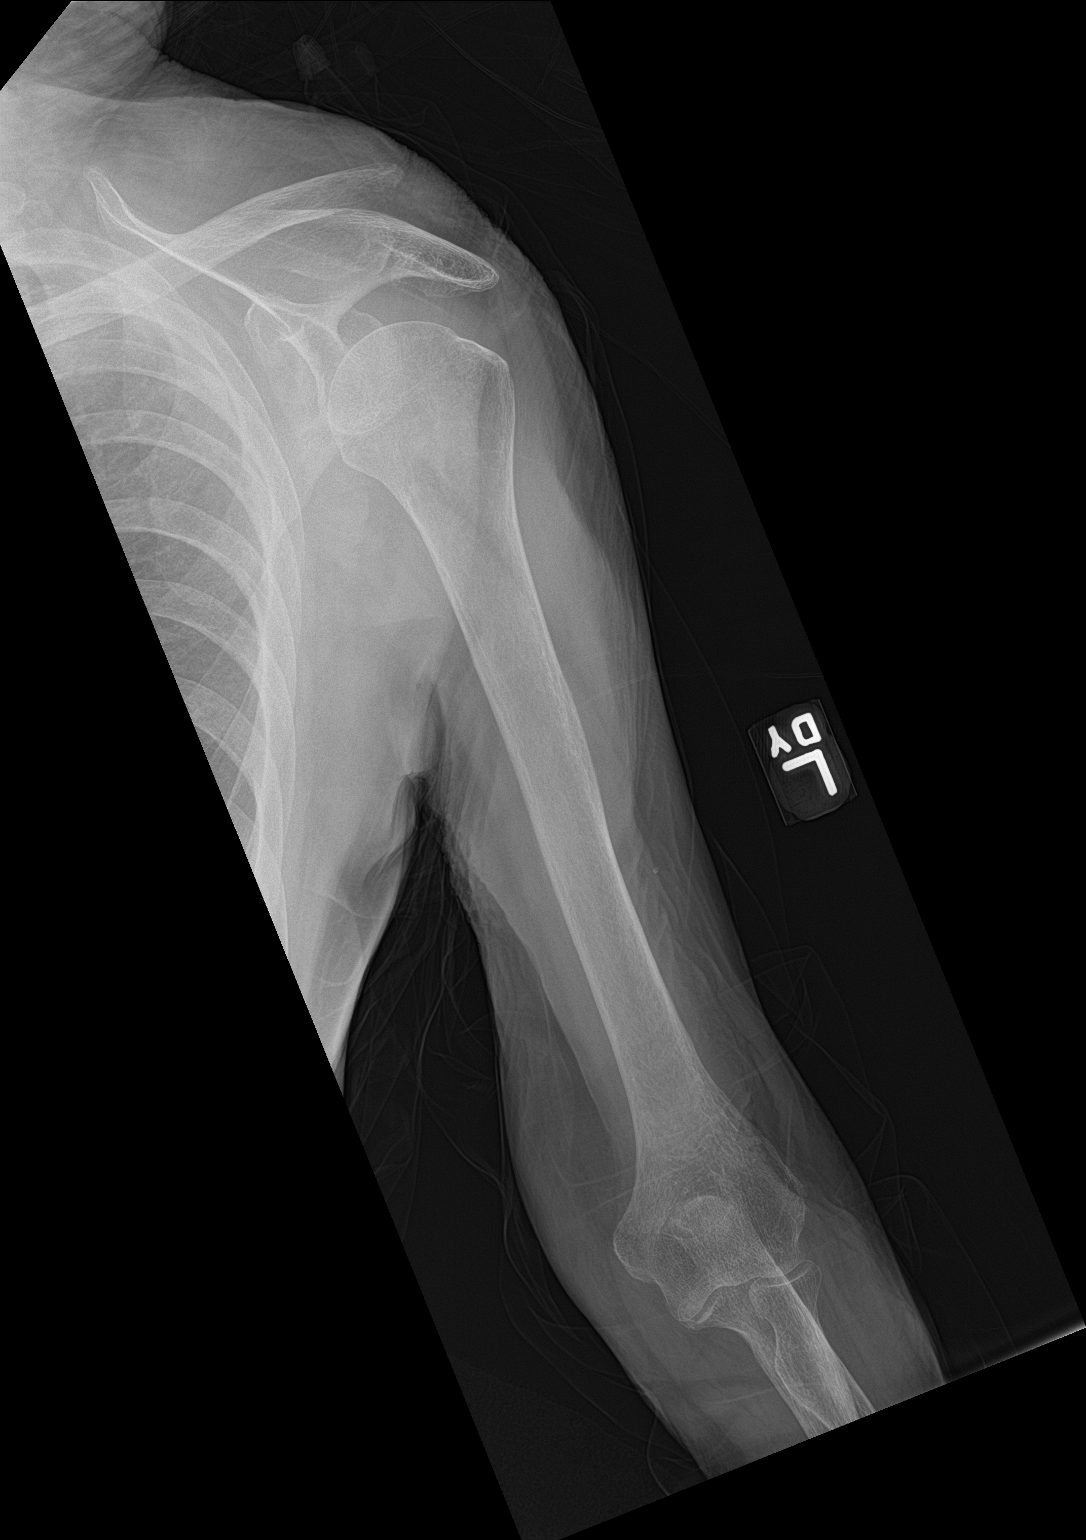

[humerus lat]
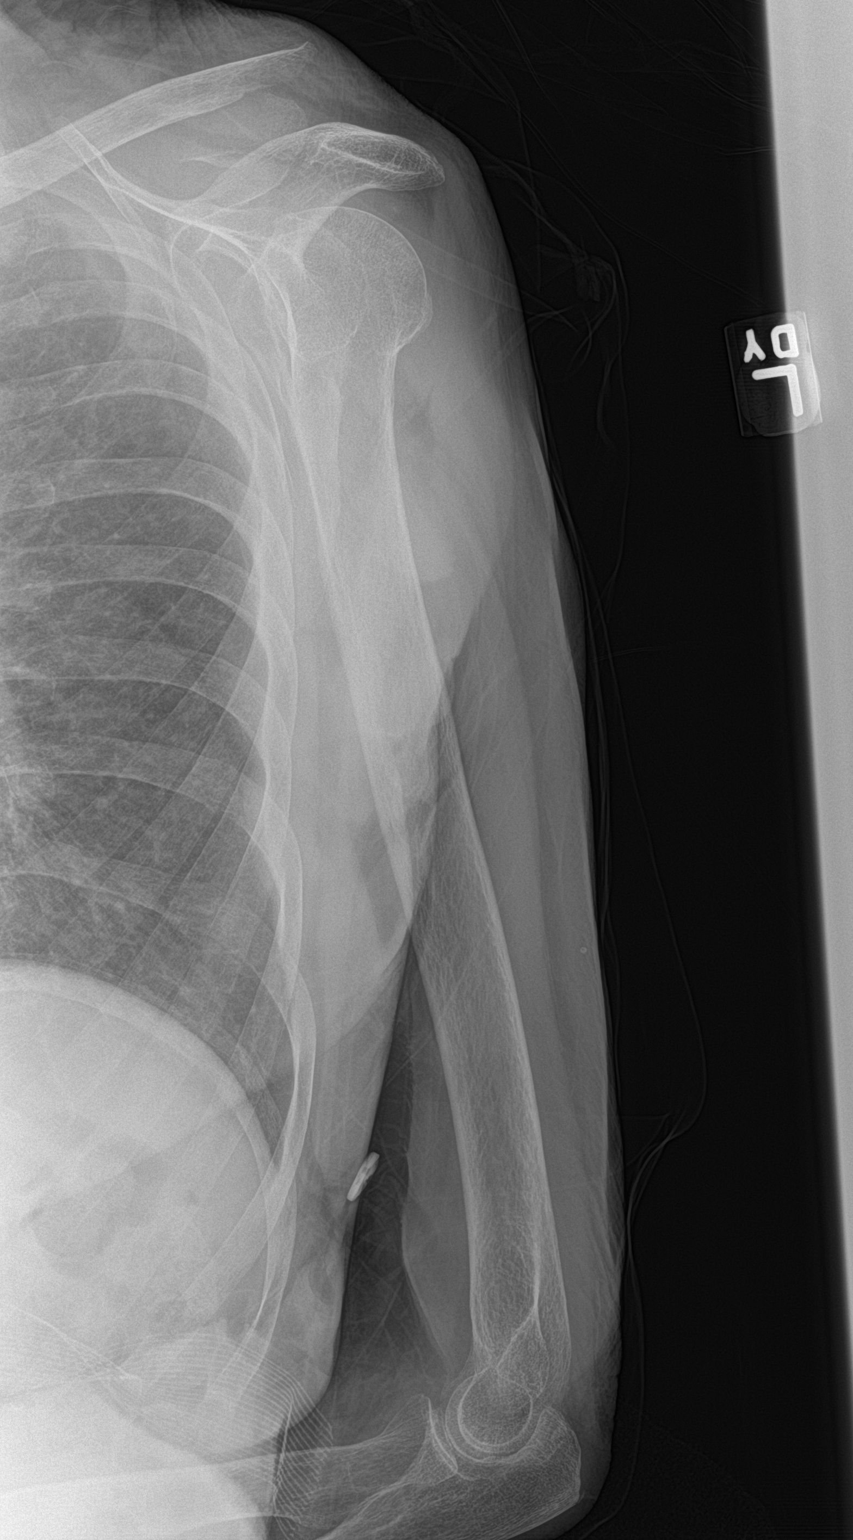

[2 of 2 positions shown; findings below may reference images not displayed]

FINDINGS: Displaced distal clavicle fracture. Left hemi thorax otherwise
unremarkable. No fracture about the humerus. there is likely chronic
ossific tendinitis about the lateral epicondyle of the distal
humerus.
IMPRESSION: No acute finding about the humerus.
